# Patient Record
Sex: Male | Born: 1995 | Race: Black or African American | Hispanic: No | Marital: Single | Smoking: Never smoker
Health system: Southern US, Community
[De-identification: ages and names within clinical notes are randomized; demographics above are authoritative.]

## PROBLEM LIST (undated history)

## (undated) DIAGNOSIS — R011 Cardiac murmur, unspecified: Secondary | ICD-10-CM

## (undated) HISTORY — PX: TYMPANOSTOMY TUBE PLACEMENT: SHX32

---
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
## 2017-01-25 ENCOUNTER — Telehealth: Payer: Self-pay | Admitting: Pediatrics

## 2017-01-25 DIAGNOSIS — R590 Localized enlarged lymph nodes: Secondary | ICD-10-CM

## 2017-01-25 NOTE — Telephone Encounter (Signed)
Spoke with mother back in December and mother stated she would call the ENT and schedule appointment for when patient was on spring Break. Called mother today to confirm that she would call ENT as soon as she knew the spring break dates for patient.

## 2017-01-25 NOTE — Telephone Encounter (Signed)
Discuss results of CBC with mom and wnl.  She never got a call from ENT to schedule for cervical Lymphadnopathy but she thinks he will only be able to be seen during spring break.  Told to discuss with him to find out the dates and we can try an let ENT know this when its scheduled to try and get a date that works.

## 2017-01-25 NOTE — Telephone Encounter (Signed)
Mom would like the results of the last lab work done over the holidays per mom please

## 2017-03-03 ENCOUNTER — Ambulatory Visit (INDEPENDENT_AMBULATORY_CARE_PROVIDER_SITE_OTHER): Payer: Self-pay | Admitting: Pediatrics

## 2017-03-03 VITALS — Wt 220.0 lb

## 2017-03-03 DIAGNOSIS — B349 Viral infection, unspecified: Secondary | ICD-10-CM

## 2017-03-03 DIAGNOSIS — R509 Fever, unspecified: Secondary | ICD-10-CM

## 2017-03-03 LAB — POCT INFLUENZA B: Rapid Influenza B Ag: NEGATIVE

## 2017-03-03 LAB — POCT INFLUENZA A: RAPID INFLUENZA A AGN: NEGATIVE

## 2017-03-03 NOTE — Patient Instructions (Signed)
Viral Illness, Pediatric Viruses are tiny germs that can get into a person's body and cause illness. There are many different types of viruses, and they cause many types of illness. Viral illness in children is very common. A viral illness can cause fever, sore throat, cough, rash, or diarrhea. Most viral illnesses that affect children are not serious. Most go away after several days without treatment. The most common types of viruses that affect children are:  Cold and flu viruses.  Stomach viruses.  Viruses that cause fever and rash. These include illnesses such as measles, rubella, roseola, fifth disease, and chicken pox. Viral illnesses also include serious conditions such as HIV/AIDS (human immunodeficiency virus/acquired immunodeficiency syndrome). A few viruses have been linked to certain cancers. What are the causes? Many types of viruses can cause illness. Viruses invade cells in your child's body, multiply, and cause the infected cells to malfunction or die. When the cell dies, it releases more of the virus. When this happens, your child develops symptoms of the illness, and the virus continues to spread to other cells. If the virus takes over the function of the cell, it can cause the cell to divide and grow out of control, as is the case when a virus causes cancer. Different viruses get into the body in different ways. Your child is most likely to catch a virus from being exposed to another person who is infected with a virus. This may happen at home, at school, or at child care. Your child may get a virus by:  Breathing in droplets that have been coughed or sneezed into the air by an infected person. Cold and flu viruses, as well as viruses that cause fever and rash, are often spread through these droplets.  Touching anything that has been contaminated with the virus and then touching his or her nose, mouth, or eyes. Objects can be contaminated with a virus if:  They have droplets on  them from a recent cough or sneeze of an infected person.  They have been in contact with the vomit or stool (feces) of an infected person. Stomach viruses can spread through vomit or stool.  Eating or drinking anything that has been in contact with the virus.  Being bitten by an insect or animal that carries the virus.  Being exposed to blood or fluids that contain the virus, either through an open cut or during a transfusion. What are the signs or symptoms? Symptoms vary depending on the type of virus and the location of the cells that it invades. Common symptoms of the main types of viral illnesses that affect children include: Cold and flu viruses   Fever.  Sore throat.  Aches and headache.  Stuffy nose.  Earache.  Cough. Stomach viruses   Fever.  Loss of appetite.  Vomiting.  Stomachache.  Diarrhea. Fever and rash viruses   Fever.  Swollen glands.  Rash.  Runny nose. How is this treated? Most viral illnesses in children go away within 3?10 days. In most cases, treatment is not needed. Your child's health care provider may suggest over-the-counter medicines to relieve symptoms. A viral illness cannot be treated with antibiotic medicines. Viruses live inside cells, and antibiotics do not get inside cells. Instead, antiviral medicines are sometimes used to treat viral illness, but these medicines are rarely needed in children. Many childhood viral illnesses can be prevented with vaccinations (immunization shots). These shots help prevent flu and many of the fever and rash viruses. Follow these instructions at  home: Medicines   Give over-the-counter and prescription medicines only as told by your child's health care provider. Cold and flu medicines are usually not needed. If your child has a fever, ask the health care provider what over-the-counter medicine to use and what amount (dosage) to give.  Do not give your child aspirin because of the association with  Reye syndrome.  If your child is older than 4 years and has a cough or sore throat, ask the health care provider if you can give cough drops or a throat lozenge.  Do not ask for an antibiotic prescription if your child has been diagnosed with a viral illness. That will not make your child's illness go away faster. Also, frequently taking antibiotics when they are not needed can lead to antibiotic resistance. When this develops, the medicine no longer works against the bacteria that it normally fights. Eating and drinking    If your child is vomiting, give only sips of clear fluids. Offer sips of fluid frequently. Follow instructions from your child's health care provider about eating or drinking restrictions.  If your child is able to drink fluids, have the child drink enough fluid to keep his or her urine clear or pale yellow. General instructions   Make sure your child gets a lot of rest.  If your child has a stuffy nose, ask your child's health care provider if you can use salt-water nose drops or spray.  If your child has a cough, use a cool-mist humidifier in your child's room.  If your child is older than 1 year and has a cough, ask your child's health care provider if you can give teaspoons of honey and how often.  Keep your child home and rested until symptoms have cleared up. Let your child return to normal activities as told by your child's health care provider.  Keep all follow-up visits as told by your child's health care provider. This is important. How is this prevented? To reduce your child's risk of viral illness:  Teach your child to wash his or her hands often with soap and water. If soap and water are not available, he or she should use hand sanitizer.  Teach your child to avoid touching his or her nose, eyes, and mouth, especially if the child has not washed his or her hands recently.  If anyone in the household has a viral infection, clean all household surfaces  that may have been in contact with the virus. Use soap and hot water. You may also use diluted bleach.  Keep your child away from people who are sick with symptoms of a viral infection.  Teach your child to not share items such as toothbrushes and water bottles with other people.  Keep all of your child's immunizations up to date.  Have your child eat a healthy diet and get plenty of rest. Contact a health care provider if:  Your child has symptoms of a viral illness for longer than expected. Ask your child's health care provider how long symptoms should last.  Treatment at home is not controlling your child's symptoms or they are getting worse. Get help right away if:  Your child who is younger than 3 months has a temperature of 100F (38C) or higher.  Your child has vomiting that lasts more than 24 hours.  Your child has trouble breathing.  Your child has a severe headache or has a stiff neck. This information is not intended to replace advice given to you by  your health care provider. Make sure you discuss any questions you have with your health care provider. Document Released: 04/24/2016 Document Revised: 05/27/2016 Document Reviewed: 04/24/2016 Elsevier Interactive Patient Education  2017 [employer].

## 2017-03-04 ENCOUNTER — Encounter: Payer: Self-pay | Admitting: Pediatrics

## 2017-03-04 DIAGNOSIS — R509 Fever, unspecified: Secondary | ICD-10-CM | POA: Insufficient documentation

## 2017-03-04 DIAGNOSIS — B349 Viral infection, unspecified: Secondary | ICD-10-CM | POA: Insufficient documentation

## 2017-03-04 NOTE — Progress Notes (Signed)
Subjective:     History was provided by the patient and mother. [name] is a 21 y.o. male here for evaluation of congestion, fever and tiredness/weakness. Symptoms began 2 days ago, with little improvement since that time. Associated symptoms include nasal congestion, nonproductive cough and sneezing. Patient denies chills, dyspnea, eye irritation and myalgias.   The following portions of the patient's history were reviewed and updated as appropriate: allergies, current medications, past family history, past medical history, past social history, past surgical history and problem list.  Review of Systems Pertinent items are noted in HPI   Objective:    Wt 220 lb (99.8 kg)   BMI 27.50 kg/m  General:   alert, cooperative and no distress  HEENT:   ENT exam normal, no neck nodes or sinus tenderness, airway not compromised and postnasal drip noted  Neck:  no adenopathy and supple, symmetrical, trachea midline.  Lungs:  clear to auscultation bilaterally  Heart:  regular rate and rhythm, S1, S2 normal, no murmur, click, rub or gallop  Abdomen:   soft, non-tender; bowel sounds normal; no masses,  no organomegaly  Skin:   reveals no rash     Extremities:   extremities normal, atraumatic, no cyanosis or edema     Neurological:  alert, oriented x 3, no defects noted in general exam.     Assessment:    Non-specific viral syndrome.   Plan:    Normal progression of disease discussed. All questions answered. Explained the rationale for symptomatic treatment rather than use of an antibiotic. Instruction provided in the use of fluids, vaporizer, acetaminophen, and other OTC medication for symptom control. Extra fluids Analgesics as needed, dose reviewed. Follow up as needed should symptoms fail to improve.

## 2017-07-16 ENCOUNTER — Encounter: Payer: Self-pay | Admitting: Pediatrics

## 2017-10-17 IMAGING — CT CT MAXILLOFACIAL W/O CM
3 series · 16 of 47 positions shown, 19 images · non-contrast
Comparison: None.

CLINICAL DATA: Right jaw pain after fall.

EXAM:
CT MAXILLOFACIAL WITHOUT CONTRAST
TECHNIQUE: Multidetector CT imaging of the maxillofacial structures was
performed. Multiplanar CT image reconstructions were also generated.
A small metallic BB was placed on the right temple in order to
reliably differentiate right from left.

[Series 2: facialbone 2.0 st · axial · 0.32mm/px · z∈[-277,-143]mm · 10 of 79 slices shown, 13 images]
[im 6/79  brain]
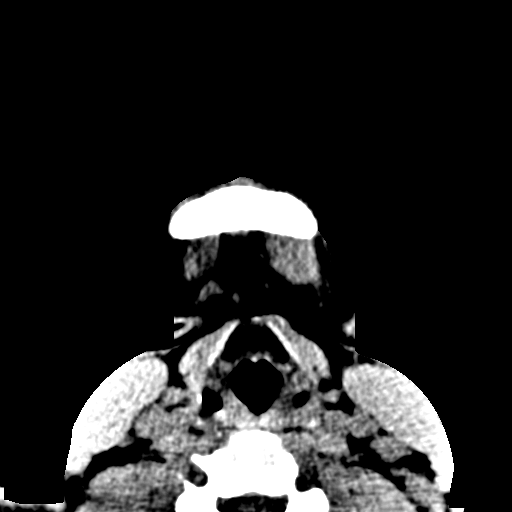
[im 6/79  bone]
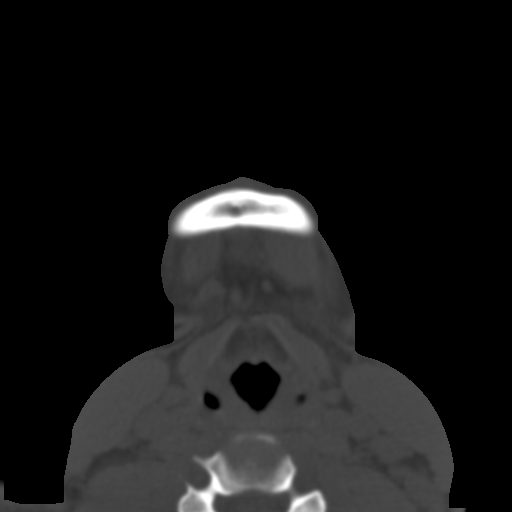
[im 14/79  bone]
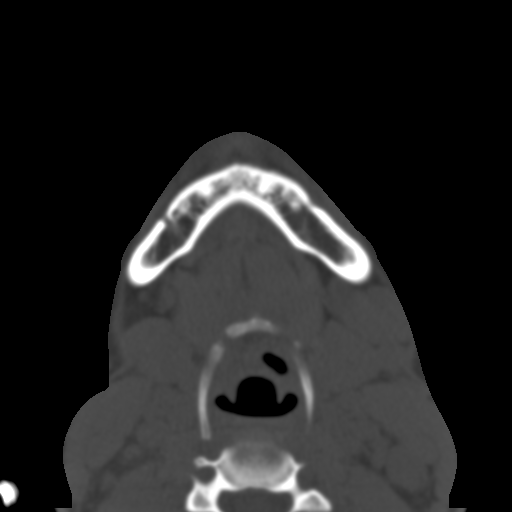
[im 22/79  bone]
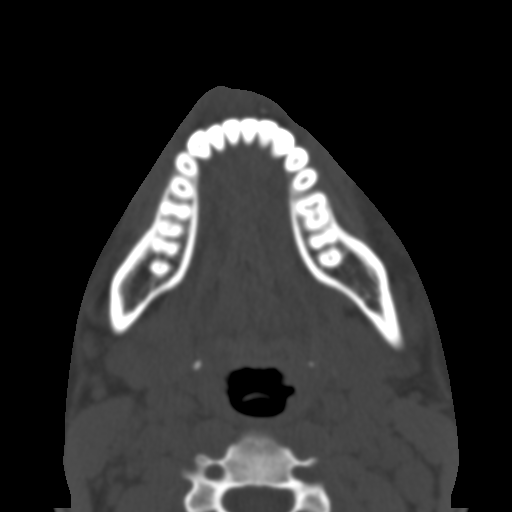
[im 27/79  bone]
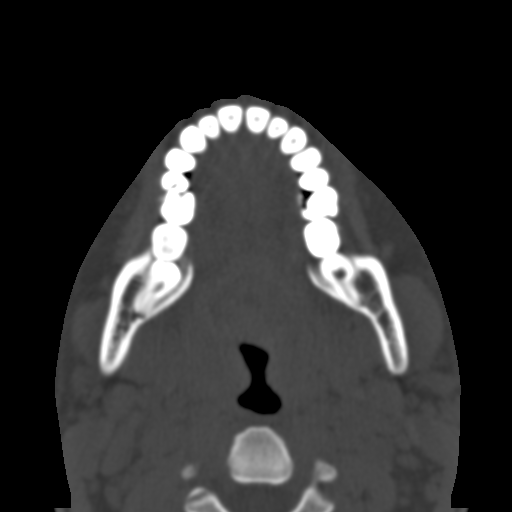
[im 35/79  brain]
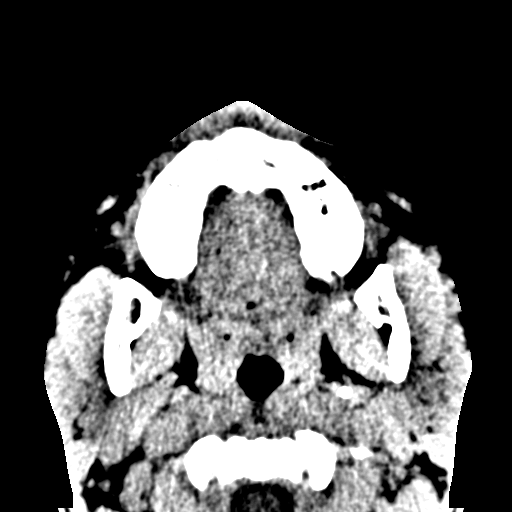
[im 35/79  bone]
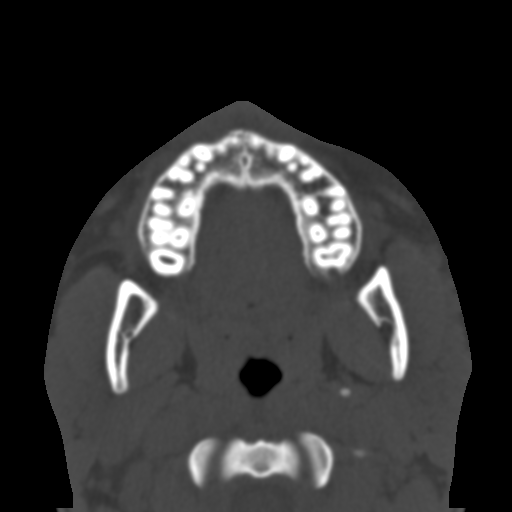
[im 44/79  bone]
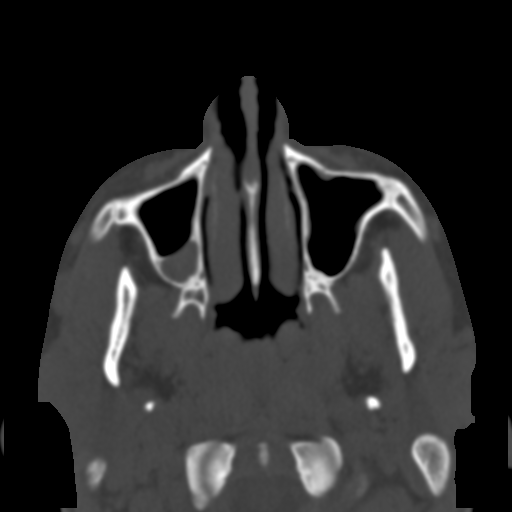
[im 52/79  bone]
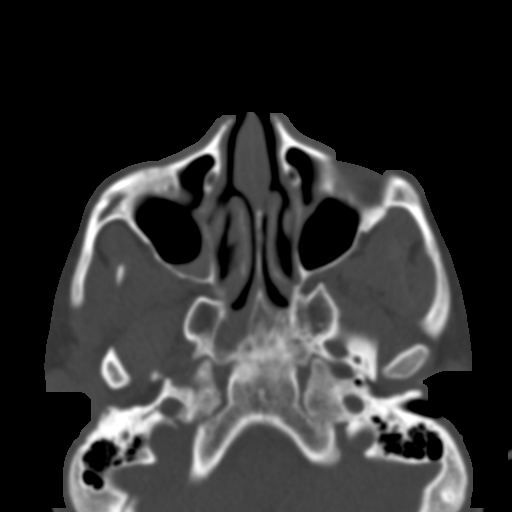
[im 60/79  bone]
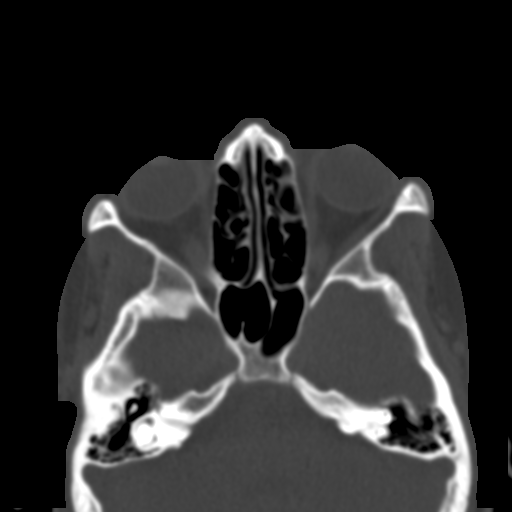
[im 65/79  brain]
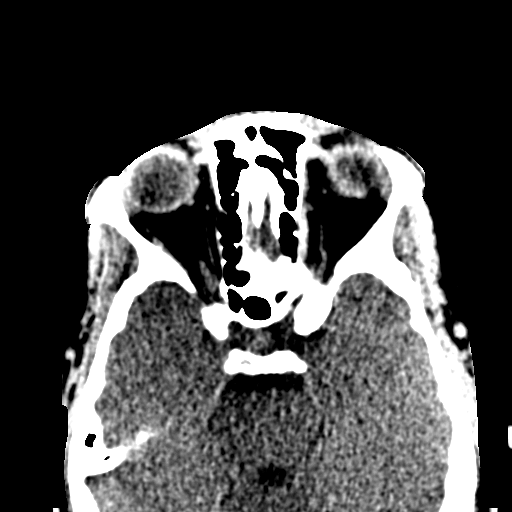
[im 65/79  bone]
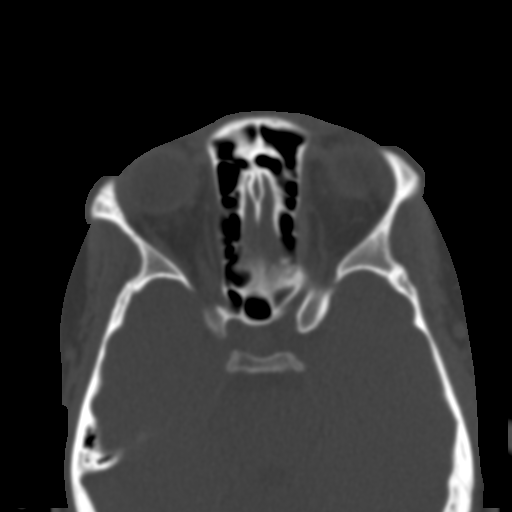
[im 73/79  bone]
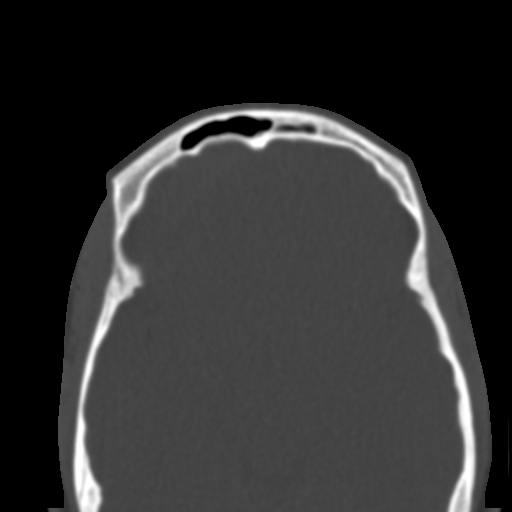

[Series 6: facialbone 2.0 cor st · coronal · 0.33mm/px · 3 of 76 slices shown]
[im 26/76  bone]
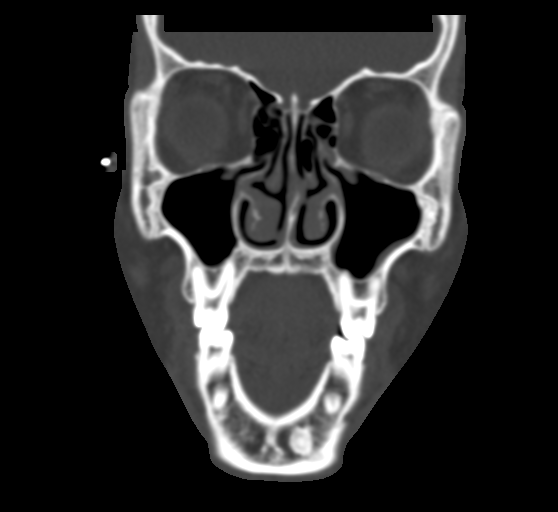
[im 34/76  bone]
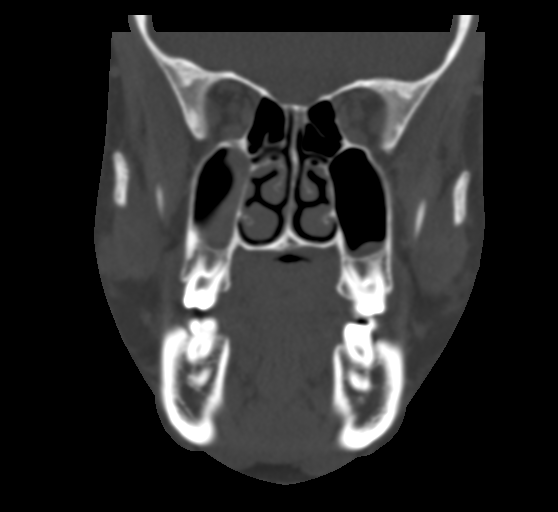
[im 42/76  bone]
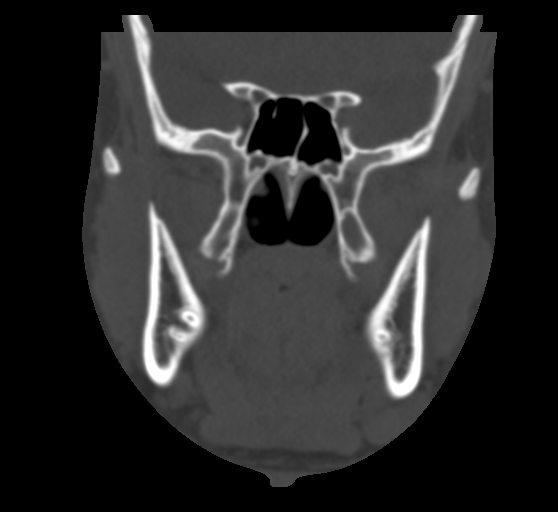

[Series 7: facialbone 2.0 sag st · sagittal · 0.32mm/px · 3 of 81 slices shown]
[im 27/81  bone]
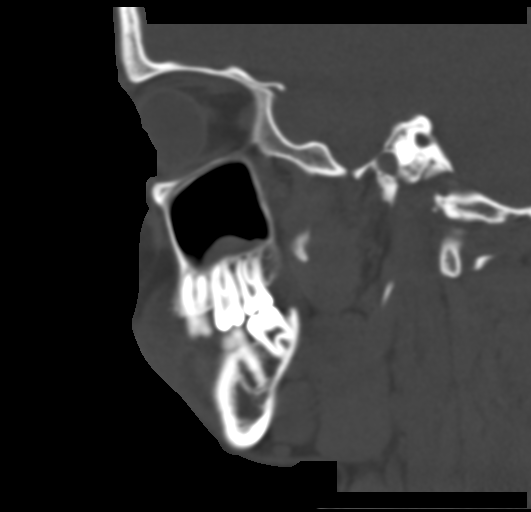
[im 41/81  bone]
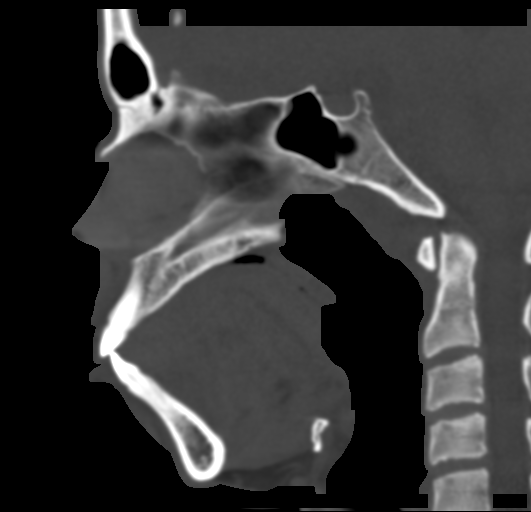
[im 54/81  bone]
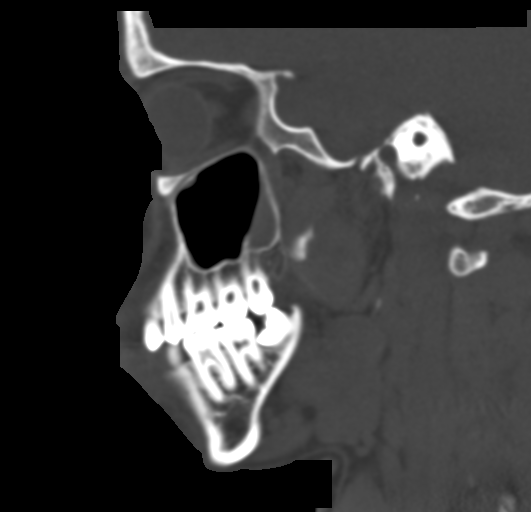

[16 of 47 positions shown; findings below may reference images not displayed]

FINDINGS: Osseous: No fracture or other bony abnormality is noted.

Orbits: Globes and orbits appear normal.

Sinuses: Mild mucosal thickening is noted in both maxillary sinuses.
Remaining paranasal sinuses appear normal.

Soft tissues: Mild bilateral posterior cervical adenopathy is noted,
with left greater than right. Largest lymph node measures 11 mm 17
mm in left submandibular region.

Limited intracranial: No definite abnormality seen.
IMPRESSION: Mild bilateral maxillary sinusitis.

Mild bilateral posterior cervical adenopathy is noted, as well as
significantly enlarged left submandibular adenopathy. This may
simply be inflammatory in etiology, but neoplasm cannot be excluded.
Clinical correlation is recommended.

No evidence of fracture or other bony abnormality is noted.

## 2017-12-15 ENCOUNTER — Other Ambulatory Visit: Payer: Self-pay

## 2017-12-15 ENCOUNTER — Emergency Department (HOSPITAL_COMMUNITY)
Admission: EM | Admit: 2017-12-15 | Discharge: 2017-12-16 | Disposition: A | Discharge status: Home / Self Care | Payer: BC Managed Care – PPO | Attending: Emergency Medicine | Admitting: Emergency Medicine

## 2017-12-15 ENCOUNTER — Encounter (HOSPITAL_COMMUNITY): Payer: Self-pay | Admitting: Emergency Medicine

## 2017-12-15 DIAGNOSIS — W500XXA Accidental hit or strike by another person, initial encounter: Secondary | ICD-10-CM | POA: Diagnosis not present

## 2017-12-15 DIAGNOSIS — J45909 Unspecified asthma, uncomplicated: Secondary | ICD-10-CM | POA: Insufficient documentation

## 2017-12-15 DIAGNOSIS — S01511A Laceration without foreign body of lip, initial encounter: Secondary | ICD-10-CM | POA: Diagnosis not present

## 2017-12-15 DIAGNOSIS — Y998 Other external cause status: Secondary | ICD-10-CM | POA: Insufficient documentation

## 2017-12-15 DIAGNOSIS — Y92310 Basketball court as the place of occurrence of the external cause: Secondary | ICD-10-CM | POA: Insufficient documentation

## 2017-12-15 DIAGNOSIS — Z23 Encounter for immunization: Secondary | ICD-10-CM | POA: Diagnosis not present

## 2017-12-15 DIAGNOSIS — Y9367 Activity, basketball: Secondary | ICD-10-CM | POA: Insufficient documentation

## 2017-12-15 MED ORDER — TETANUS-DIPHTH-ACELL PERTUSSIS 5-2.5-18.5 LF-MCG/0.5 IM SUSP
0.5000 mL | Freq: Once | INTRAMUSCULAR | Status: AC
  Administered 2017-12-15: 0.5 mL via INTRAMUSCULAR
  Filled 2017-12-15: qty 0.5

## 2017-12-15 MED ORDER — LIDOCAINE-EPINEPHRINE (PF) 2 %-1:200000 IJ SOLN
10.0000 mL | Freq: Once | INTRAMUSCULAR | Status: AC
  Administered 2017-12-15: 5 mL via INTRADERMAL
  Filled 2017-12-15: qty 20

## 2017-12-15 NOTE — ED Provider Notes (Signed)
[facility] EMERGENCY DEPARTMENT Provider Note   CSN: [id number] Arrival date & time: 12/15/17  2154     History   Chief Complaint Chief Complaint  Patient presents with  . Lip Laceration    HPI Benjamin Doyle is a 21 y.o. male.  HPI   Benjamin Doyle is a 21 year old male with a history of asthma who presents to the emergency department for evaluation of right upper lip laceration.  Patient states that he was playing basketball about 2 hours prior to arrival and was elbowed in the lip.  Noticed immediate bleeding which ceased with pressure.  He did not clean out the wound.  He denies tooth injury or in her mouth injury.  Denies pain to the lip.  Denies facial swelling, fever, dysphagia.  He is unsure if he is up-to-date on his tetanus shot.  Past Medical History:  Diagnosis Date  . Asthma   . Heart murmur     Patient Active Problem List   Diagnosis Date Noted  . Viral syndrome 03/04/2017  . Fever 03/04/2017  . Cervical lymphadenopathy 12/25/2016  . Well child check 09/23/2015  . Immunization due 08/17/2014  . Subconjunctival hemorrhage of right eye 08/17/2014  . BMI (body mass index), pediatric, 85% to less than 95% for age 58/17/2013  . Idiopathic gynecomastia 10/13/2012    Past Surgical History:  Procedure Laterality Date  . TYMPANOSTOMY TUBE PLACEMENT         Home Medications    Prior to Admission medications   Medication Sig Start Date End Date Taking? Authorizing Provider  albuterol (PROVENTIL HFA;VENTOLIN HFA) 108 (90 BASE) MCG/ACT inhaler Inhale 2 puffs into the lungs every 6 (six) hours as needed for wheezing or shortness of breath. 01/19/14 02/19/14  Marcha Solders, MD    Family History Family History  Problem Relation Age of Onset  . Hypertension Mother   . Hyperlipidemia Father   . Hypertension Father   . Hypertension Maternal Grandmother   . Diabetes Maternal Grandfather   . Birth defects Paternal Grandmother   .  Hyperlipidemia Paternal Grandmother   . Hypertension Paternal Grandmother   . Hypertension Paternal Grandfather   . Alcohol abuse Neg Hx   . Arthritis Neg Hx   . Asthma Neg Hx   . Cancer Neg Hx   . COPD Neg Hx   . Depression Neg Hx   . Drug abuse Neg Hx   . Early death Neg Hx   . Hearing loss Neg Hx   . Heart disease Neg Hx   . Kidney disease Neg Hx   . Learning disabilities Neg Hx   . Mental illness Neg Hx   . Mental retardation Neg Hx   . Miscarriages / Stillbirths Neg Hx   . Stroke Neg Hx   . Vision loss Neg Hx   . Varicose Veins Neg Hx     Social History Social History   Tobacco Use  . Smoking status: Never Smoker  . Smokeless tobacco: Never Used  Substance Use Topics  . Alcohol use: No    Frequency: Never  . Drug use: No     Allergies   Patient has no known allergies.   Review of Systems Review of Systems  Constitutional: Negative for chills and fever.  HENT: Negative for facial swelling and trouble swallowing.   Skin: Positive for wound (right upper lip).     Physical Exam Updated Vital Signs BP 136/64 (BP Location: Right Arm)   Pulse 87  Temp 99 F (37.2 C) (Oral)   Resp 16   Ht 6\' 3"  (1.905 m)   Wt 87.5 kg (193 lb)   SpO2 100%   BMI 24.12 kg/m   Physical Exam  Constitutional: He appears well-developed and well-nourished. No distress.  HENT:  Head: Normocephalic and atraumatic.  Mouth/Throat: Oropharynx is clear and moist. No oropharyngeal exudate.    Approximately 1 cm laceration on the right upper lip.  It is approximately 80mm deep.  No tenderness overlying.  No surrounding erythema, edema, warmth.  Teeth intact.  No inner lip laceration.   Eyes: Right eye exhibits no discharge. Left eye exhibits no discharge.  Pulmonary/Chest: Effort normal. No respiratory distress.  Neurological: He is alert. Coordination normal.  Skin: He is not diaphoretic.  Psychiatric: He has a normal mood and affect. His behavior is normal.  Nursing note and  vitals reviewed.    ED Treatments / Results  Labs (all labs ordered are listed, but only abnormal results are displayed) Labs Reviewed - No data to display  EKG  EKG Interpretation None       Radiology No results found.  Procedures .Marland KitchenLaceration Repair Date/Time: 12/16/2017 12:13 AM Performed by: Glyn Ade, PA-C Authorized by: Glyn Ade, PA-C   Consent:    Consent obtained:  Verbal and emergent situation   Consent given by:  Patient   Risks discussed:  Infection, pain, poor cosmetic result and poor wound healing   Alternatives discussed:  No treatment and delayed treatment Anesthesia (see MAR for exact dosages):    Anesthesia method:  Local infiltration   Local anesthetic:  Lidocaine 2% WITH epi Laceration details:    Location:  Lip   Lip location:  Upper exterior lip   Length (cm):  1   Depth (mm):  6 Repair type:    Repair type:  Simple Pre-procedure details:    Preparation:  Patient was prepped and draped in usual sterile fashion Exploration:    Hemostasis achieved with:  Direct pressure   Wound exploration: wound explored through full range of motion and entire depth of wound probed and visualized   Treatment:    Area cleansed with:  Betadine   Amount of cleaning:  Extensive   Irrigation solution:  Sterile saline   Irrigation method:  Pressure wash Skin repair:    Repair method:  Sutures   Suture size:  6-0   Suture material:  Prolene   Suture technique:  Simple interrupted   Number of sutures:  2 Post-procedure details:    Dressing:  Open (no dressing)   Patient tolerance of procedure:  Tolerated well, no immediate complications   (including critical care time)  Medications Ordered in ED Medications  lidocaine-EPINEPHrine (XYLOCAINE W/EPI) 2 %-1:200000 (PF) injection 10 mL (not administered)  Tdap (BOOSTRIX) injection 0.5 mL (not administered)     Initial Impression / Assessment and Plan / ED Course  I have reviewed the  triage vital signs and the nursing notes.  Pertinent labs & imaging results that were available during my care of the patient were reviewed by me and considered in my medical decision making (see chart for details).    Pressure irrigation performed. Wound explored and base of wound visualized in a bloodless field without evidence of foreign body.  Laceration occurred < 8 hours prior to repair which was well tolerated. Tdap updated.  Pt has no comorbidities to effect normal wound healing. Pt discharged without antibiotics.  Discussed suture home care with patient and  answered questions. Pt to follow-up for wound check and suture removal in 7 days; they are to return to the ED sooner for signs of infection. Pt is hemodynamically stable with no complaints prior to dc.   Final Clinical Impressions(s) / ED Diagnoses   Final diagnoses:  Lip laceration, initial encounter    ED Discharge Orders    None       Bernarda Caffey 12/16/17 0106    Tegeler, Gwenyth Allegra, MD 12/16/17 [phone]

## 2017-12-15 NOTE — ED Triage Notes (Signed)
Pt st's he was elbowed in the mouth.  Pt has lac to right upper lip.  Pt denies dental injury

## 2017-12-15 NOTE — ED Notes (Signed)
Patient is A&Ox4.  No signs of distress noted.  Please see providers complete history and physical exam.  

## 2017-12-16 NOTE — Discharge Instructions (Signed)
Your Tdap was updated in the emergency department today.  Please gently wash the wound with warm water beginning tomorrow.  Do this once a day.  Schedule an appointment with your pediatrician to have the sutures removed in 7 days.  Return to the emergency department if you notice worsening redness and swelling over the stitches, fever greater than 100.4 F or have any new or worsening symptoms.

## 2017-12-23 ENCOUNTER — Ambulatory Visit: Payer: BC Managed Care – PPO | Admitting: Pediatrics

## 2017-12-23 VITALS — Wt 189.5 lb

## 2017-12-23 DIAGNOSIS — Z4802 Encounter for removal of sutures: Secondary | ICD-10-CM

## 2017-12-23 NOTE — Patient Instructions (Signed)
Suture Removal, Care After Refer to this sheet in the next few weeks. These instructions provide you with information on caring for yourself after your procedure. Your health care provider may also give you more specific instructions. Your treatment has been planned according to current medical practices, but problems sometimes occur. Call your health care provider if you have any problems or questions after your procedure. What can I expect after the procedure? After your stitches (sutures) are removed, it is typical to have the following:  Some discomfort and swelling in the wound area.  Slight redness in the area.  Follow these instructions at home:  If you have skin adhesive strips over the wound area, do not take the strips off. They will fall off on their own in a few days. If the strips remain in place after 14 days, you may remove them.  Change any bandages (dressings) at least once a day or as directed by your health care provider. If the bandage sticks, soak it off with warm, soapy water.  Apply cream or ointment only as directed by your health care provider. If using cream or ointment, wash the area with soap and water 2 times a day to remove all the cream or ointment. Rinse off the soap and pat the area dry with a clean towel.  Keep the wound area dry and clean. If the bandage becomes wet or dirty, or if it develops a bad smell, change it as soon as possible.  Continue to protect the wound from injury.  Use sunscreen when out in the sun. New scars become sunburned easily. Contact a health care provider if:  You have increasing redness, swelling, or pain in the wound.  You see pus coming from the wound.  You have a fever.  You notice a bad smell coming from the wound or dressing.  Your wound breaks open (edges not staying together). This information is not intended to replace advice given to you by your health care provider. Make sure you discuss any questions you have  with your health care provider. Document Released: 09/08/2001 Document Revised: 05/21/2016 Document Reviewed: 07/26/2013 Elsevier Interactive Patient Education  2017 [employer]  

## 2017-12-23 NOTE — Progress Notes (Signed)
  Subjective:    Benjamin Doyle is a 21 y.o. old male here with his patient for Suture / Staple Removal   HPI: Benjamin Doyle presents with history of recent laceration to right upper lip from basketball.  He was seen in the ED a week ago and 2 stitches were placed.  There as been no sign of infection and he denies any current pain.    The following portions of the patient's history were reviewed and updated as appropriate: allergies, current medications, past family history, past medical history, past social history, past surgical history and problem list.  Review of Systems Pertinent items are noted in HPI.   Allergies: No Known Allergies   Current Outpatient Medications on File Prior to Visit  Medication Sig Dispense Refill  . albuterol (PROVENTIL HFA;VENTOLIN HFA) 108 (90 BASE) MCG/ACT inhaler Inhale 2 puffs into the lungs every 6 (six) hours as needed for wheezing or shortness of breath. 2 Inhaler 3   No current facility-administered medications on file prior to visit.     History and Problem List: Past Medical History:  Diagnosis Date  . Asthma   . Heart murmur         Objective:    Wt 189 lb 8 oz (86 kg)   BMI 23.69 kg/m   General: alert, active, cooperative, non toxic Skin: no rashes, stitches to lip boarder Neuro: normal mental status, No focal deficits  No results found for this or any previous visit (from the past 72 hour(s)).     Assessment:   Benjamin Doyle is a 21 y.o. old male with  1. Visit for suture removal     Plan:   1.  Sutures removed with no complication.  Discussed normal post suture care.      No orders of the defined types were placed in this encounter.    Return if symptoms worsen or fail to improve. in 2-3 days or prior for concerns  Kristen Loader, DO

## 2017-12-28 ENCOUNTER — Encounter: Payer: Self-pay | Admitting: Pediatrics

## 2018-02-21 ENCOUNTER — Ambulatory Visit (INDEPENDENT_AMBULATORY_CARE_PROVIDER_SITE_OTHER): Payer: BC Managed Care – PPO | Admitting: Pediatrics

## 2018-02-21 VITALS — Temp 98.9°F | Wt 181.7 lb

## 2018-02-21 DIAGNOSIS — R590 Localized enlarged lymph nodes: Secondary | ICD-10-CM

## 2018-02-21 NOTE — Progress Notes (Signed)
  Subjective:    Benjamin Doyle is a 22 y.o. old male here with his mother for questions    HPI: Artin presents with history of cervical lymph nodes.  He continues to have the cervical nodes on left that have not changed since last visit.  He reports that it has stayed the same size.  He has lost weight and has lost about 50lb in 2 years but has been a lot of cardio, eating habits and working out and has been trying to get healthier.  He was seen for enlarged cervical nodes last year and they are unchanged but never f/u with ENT.  He had questions about some other spots on his body in his legs where he feels lumpy areas.  Denies any fevers, HA, night sweats, diarrhea, diff breathing.      The following portions of the patient's history were reviewed and updated as appropriate: allergies, current medications, past family history, past medical history, past social history, past surgical history and problem list.  Review of Systems Pertinent items are noted in HPI.   Allergies: No Known Allergies   Current Outpatient Medications on File Prior to Visit  Medication Sig Dispense Refill  . albuterol (PROVENTIL HFA;VENTOLIN HFA) 108 (90 BASE) MCG/ACT inhaler Inhale 2 puffs into the lungs every 6 (six) hours as needed for wheezing or shortness of breath. 2 Inhaler 3   No current facility-administered medications on file prior to visit.     History and Problem List: Past Medical History:  Diagnosis Date  . Asthma   . Heart murmur         Objective:    Temp 98.9 F (37.2 C) (Temporal)   Wt 181 lb 11.2 oz (82.4 kg)   BMI 22.71 kg/m   General: alert, active, cooperative, non toxic ENT: oropharynx moist, no lesions, nares no discharge, 2 enlarged left cervical nodes largest 2-2.5cm mobile and not tender, submandibular 2cm Eye:  PERRL, EOMI, conjunctivae clear, no discharge Ears: TM clear/intact bilateral, no discharge Neck: supple, no sig LAD Lungs: clear to auscultation, no wheeze, crackles  or retractions Heart: RRR, Nl S1, S2, no murmurs Abd: soft, non tender, non distended, normal BS, no organomegaly, no masses appreciated Lymph.  No significant enlarged nodes in clavicular, axillary, inguinal area Skin: no rashes Neuro: normal mental status, No focal deficits  No results found for this or any previous visit (from the past 72 hour(s)).     Assessment:   Layman is a 22 y.o. old male with  1. Cervical lymphadenopathy     Plan:   1.  Discussed again to f/u with ENT.  Did not go last year but he says he will make appointment and go this spring break to get evaluated.  Discuss that ENT may want to biopsy as he has had them for 4-5 years.  He has had an increase of weight loss but reports he has significantly changed his diet and regular exercise trying to get fit.      No orders of the defined types were placed in this encounter.    Return if symptoms worsen or fail to improve. in 2-3 days or prior for concerns  Kristen Loader, DO

## 2018-02-23 ENCOUNTER — Encounter: Payer: Self-pay | Admitting: Pediatrics

## 2018-02-23 NOTE — Patient Instructions (Signed)
Lymphadenopathy Lymphadenopathy refers to swollen or enlarged lymph glands, also called lymph nodes. Lymph glands are part of your body's defense (immune) system, which protects the body from infections, germs, and diseases. Lymph glands are found in many locations in your body, including the neck, underarm, and groin. Many things can cause lymph glands to become enlarged. When your immune system responds to germs, such as viruses or bacteria, infection-fighting cells and fluid build up. This causes the glands to grow in size. Usually, this is not something to worry about. The swelling and any soreness often go away without treatment. However, swollen lymph glands can also be caused by a number of diseases. Your health care provider may do various tests to help determine the cause. If the cause of your swollen lymph glands cannot be found, it is important to monitor your condition to make sure the swelling goes away. Follow these instructions at home: Watch your condition for any changes. The following actions may help to lessen any discomfort you are feeling:  Get plenty of rest.  Take medicines only as directed by your health care provider. Your health care provider may recommend over-the-counter medicines for pain.  Apply moist heat compresses to the site of swollen lymph nodes as directed by your health care provider. This can help reduce any pain.  Check your lymph nodes daily for any changes.  Keep all follow-up visits as directed by your health care provider. This is important.  Contact a health care provider if:  Your lymph nodes are still swollen after 2 weeks.  Your swelling increases or spreads to other areas.  Your lymph nodes are hard, seem fixed to the skin, or are growing rapidly.  Your skin over the lymph nodes is red and inflamed.  You have a fever.  You have chills.  You have fatigue.  You develop a sore throat.  You have abdominal pain.  You have weight  loss.  You have night sweats. Get help right away if:  You notice fluid leaking from the area of the enlarged lymph node.  You have severe pain in any area of your body.  You have chest pain.  You have shortness of breath. This information is not intended to replace advice given to you by your health care provider. Make sure you discuss any questions you have with your health care provider. Document Released: 09/22/2008 Document Revised: 05/21/2016 Document Reviewed: 07/19/2014 Elsevier Interactive Patient Education  2018 [employer]  

## 2018-10-03 ENCOUNTER — Ambulatory Visit: Payer: BC Managed Care – PPO | Admitting: Pediatrics

## 2018-10-03 VITALS — Wt 189.3 lb

## 2018-10-03 DIAGNOSIS — R21 Rash and other nonspecific skin eruption: Secondary | ICD-10-CM

## 2018-10-03 DIAGNOSIS — Z23 Encounter for immunization: Secondary | ICD-10-CM | POA: Insufficient documentation

## 2018-10-03 DIAGNOSIS — L299 Pruritus, unspecified: Secondary | ICD-10-CM

## 2018-10-03 MED ORDER — PERMETHRIN 5 % EX CREA: 1.0000 "application " | TOPICAL_CREAM | Freq: Once | CUTANEOUS | 1 refills | Status: AC

## 2018-10-03 MED ORDER — CETIRIZINE HCL 10 MG PO TABS: 10.0000 mg | ORAL_TABLET | Freq: Every day | ORAL | 2 refills | Status: AC

## 2018-10-03 NOTE — Patient Instructions (Addendum)
Hives Hives (urticaria) are itchy, red, swollen areas on your skin. Hives can show up on any part of your body, and they can vary in size. They can be as small as the tip of a pen or much larger. Hives often fade within 24 hours (acute hives). In other cases, new hives show up after old ones fade. This can continue for many days or weeks (chronic hives). Hives are caused by your body's reaction to an irritant or to something that you are allergic to (trigger). You can get hives right after being around a trigger or hours later. Hives do not spread from person to person (are not contagious). Hives may get worse if you scratch them, if you exercise, or if you have worries (emotional stress). Follow these instructions at home: Medicines  Take or apply over-the-counter and prescription medicines only as told by your doctor.  If you were prescribed an antibiotic medicine, use it as told by your doctor. Do not stop taking the antibiotic even if you start to feel better. Skin Care  Apply cool, wet cloths (cool compresses) to the itchy, red, swollen areas.  Do not scratch your skin. Do not rub your skin. General instructions  Do not take hot showers or baths. This can make itching worse.  Do not wear tight clothes.  Use sunscreen and wear clothing that covers your skin when you are outside.  Avoid any triggers that cause your hives. Keep a journal to help you keep track of what causes your hives. Write down: ? What medicines you take. ? What you eat and drink. ? What products you use on your skin.  Keep all follow-up visits as told by your doctor. This is important. Contact a doctor if:  Your symptoms are not better with medicine.  Your joints are painful or swollen. Get help right away if:  You have a fever.  You have belly pain.  Your tongue or lips are swollen.  Your eyelids are swollen.  Your chest or throat feels tight.  You have trouble breathing or swallowing. These  symptoms may be an emergency. Do not wait to see if the symptoms will go away. Get medical help right away. Call your local emergency services (911 in the [place] Do not drive yourself to the hospital. This information is not intended to replace advice given to you by your health care provider. Make sure you discuss any questions you have with your health care provider. Document Released: 09/22/2008 Document Revised: 05/21/2016 Document Reviewed: 10/02/2015 Elsevier Interactive Patient Education  2018 [place] are tiny bugs that live in and around beds. During the day, they stay hidden. At night, they come out and bite. Where are bedbugs found? Bedbugs can be found anywhere. It does not matter if a place is clean or dirty. They are often found in:  Hotels.  Shelters.  Dorms.  Hospitals.  Nursing homes.  Places where there are many birds or bats.  What are bedbug bites like? A bedbug bite leaves a small red bump with a darker red dot in the middle. The bump may show up soon after a person is bitten or a day or more later. Bedbug bites usually do not hurt, but they may itch. Most people do not need treatment for bedbug bites. The bumps usually go away on their own in a few days. How do I check for bedbugs? Bedbugs are reddish-brown, oval, and flat. They are very small and  they cannot fly. Look for bedbugs in these places:  On mattresses, bed frames, headboards, and box springs.  On drapes and curtains in bedrooms.  Under the carpet in bedrooms.  Behind electrical outlets.  Behind any wallpaper that is peeling.  Inside luggage.  Also look for black or red spots or stains on or near the bed. What should I do if I find bedbugs? When Traveling Check your clothes, suitcase, and belongings for bedbugs before you go back home. You may want to throw away anything that has bedbugs on it. At Home Your bedroom may need to be treated by a pest control  expert. You may also need to throw away mattresses or luggage. To help keep bedbugs from coming back, you may want to:  Put a plastic cover over your mattress.  Wash your clothes and bedding in water that is hotter than 120F (48.9C). Dry them on a hot setting.  Vacuum often around the bed and in all of the cracks where the bugs might hide.  Check all used furniture, bedding, or clothes that you bring into your home.  Get rid of bird nests and bat roosts that are near your home.  In Your Bed Try wearing pajamas that have long sleeves and pant legs. Bedbugs usually bite areas of the skin that are not covered. This information is not intended to replace advice given to you by your health care provider. Make sure you discuss any questions you have with your health care provider. Document Released: 03/31/2011 Document Revised: 05/21/2016 Document Reviewed: 12/10/2014 Elsevier Interactive Patient Education  2018 [place], Adult Scabies is a skin condition that happens when very small insects get under the skin (infestation). This causes a rash and severe itchiness. Scabies can spread from person to person (is contagious). If you get scabies, it is common for others in your household to get scabies too. With proper treatment, symptoms usually go away in 2-4 weeks. Scabies usually does not cause lasting problems. What are the causes? This condition is caused by mites (Sarcoptes scabiei, or human itch mites) that can only be seen with a microscope. The mites get into the top layer of skin and lay eggs. Scabies can spread from person to person through:  Close contact with a person who has scabies.  Contact with infested items, such as towels, bedding, or clothing.  What increases the risk? This condition is more likely to develop in:  People who live in nursing homes and other extended-care facilities.  People who have sexual contact with a partner who has scabies.  Young  children who attend child care facilities.  People who care for others who are at increased risk for scabies.  What are the signs or symptoms? Symptoms of this condition may include:  Severe itchiness. This is often worse at night.  A rash that includes tiny red bumps or blisters. The rash commonly occurs on the wrist, elbow, armpit, fingers, waist, groin, or buttocks. Bumps may form a line (burrow) in some areas.  Skin irritation. This can include scaly patches or sores.  How is this diagnosed? This condition is diagnosed with a physical exam. Your health care provider will look closely at your skin. In some cases, your health care provider may take a sample of your affected skin (skin scraping) and have it examined under a microscope. How is this treated? This condition may be treated with:  Medicated cream or lotion that kills the mites. This is spread  on the entire body and left on for several hours. Usually, one treatment with medicated cream or lotion is enough to kill all of the mites. In severe cases, the treatment may be repeated.  Medicated cream that relieves itching.  Medicines that help to relieve itching.  Medicines that kill the mites. This treatment is rarely used.  Follow these instructions at home:  Medicines  Take or apply over-the-counter and prescription medicines as told by your health care provider.  Apply medicated cream or lotion as told by your health care provider.  Do not wash off the medicated cream or lotion until the necessary amount of time has passed. Skin Care  Avoid scratching your affected skin.  Keep your fingernails closely trimmed to reduce injury from scratching.  Take cool baths or apply cool washcloths to help reduce itching. General instructions  Clean all items that you recently had contact with, including bedding, clothing, and furniture. Do this on the same day that your treatment starts. ? Use hot water when you wash  items. ? Place unwashable items into closed, airtight plastic bags for at least 3 days. The mites cannot live for more than 3 days away from human skin. ? Vacuum furniture and mattresses that you use.  Make sure that other people who may have been infested are examined by a health care provider. These include members of your household and anyone who may have had contact with infested items.  Keep all follow-up visits as told by your health care provider. This is important. Contact a health care provider if:  You have itching that does not go away after 4 weeks of treatment.  You continue to develop new bumps or burrows.  You have redness, swelling, or pain in your rash area after treatment.  You have fluid, blood, or pus coming from your rash. This information is not intended to replace advice given to you by your health care provider. Make sure you discuss any questions you have with your health care provider. Document Released: 09/04/2015 Document Revised: 05/21/2016 Document Reviewed: 07/16/2015 Elsevier Interactive Patient Education  [employer].

## 2018-10-03 NOTE — Progress Notes (Signed)
  Subjective:    Benjamin Doyle is a 22 y.o. 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  Another person was also getting rash often and his father.  There is 4 all together.  Initially it was around waist.  Now it is more on legs and arm and very itchy.  On and off hives months.  He is currently back in school and had another bout of hives and rash on thigh that had multiple raised spots in a circular fashion.   Has been getting hives on and off for 2 months.  Denies fevers, HA, v/d, lethargy.   The following portions of the patient's history were reviewed and updated as appropriate: allergies, current medications, past family history, past medical history, past social history, past surgical history and problem list.  Review of Systems Pertinent items are noted in HPI.   Allergies: No Known Allergies   Current Outpatient Medications on File Prior to Visit  Medication Sig Dispense Refill  . albuterol (PROVENTIL HFA;VENTOLIN HFA) 108 (90 BASE) MCG/ACT inhaler Inhale 2 puffs into the lungs every 6 (six) hours as needed for wheezing or shortness of breath. 2 Inhaler 3   No current facility-administered medications on file prior to visit.     History and Problem List: Past Medical History:  Diagnosis Date  . Asthma   . Heart murmur         Objective:    Wt 189 lb 4.8 oz (85.9 kg)   BMI 23.66 kg/m   General: alert, active, cooperative, non toxic Lungs: clear to auscultation, no wheeze, crackles or retractions Heart: RRR, Nl S1, S2, no murmurs Abd: soft, non tender, non distended, normal BS, no organomegaly, no masses appreciated Skin: mild erythema on right thigh with excoriations, mild excoriations on legs with healing small scabs Neuro: normal mental status, No focal deficits  No results found for this or any previous visit (from the past 72 hour(s)).      Assessment:   Benjamin Doyle is a 22 y.o. old male with  1. Rash and other nonspecific skin eruption   2. Pruritus   3. Influenza vaccination administered at current visit     Plan:   1.  Will treat for potential scabies with refill if new spots in 2 weeks.  Consider bed bugs as cause and discussed ways to evaluate and given information on both conditions.  Start zyrtec daily for hive like reactions.  Hydrocortisone to areas that are itchy bid prn.   --administered flu shot at visit.   --Indications, contraindications and side effects of vaccine/vaccines discussed with parent and parent verbally expressed understanding and also agreed with the administration of vaccine/vaccines as ordered above  today.     Meds ordered this encounter  Medications  . permethrin (ELIMITE) 5 % cream    Sig: Apply 1 application topically once for 1 dose. Apply toe to neck and leave on 8-14hrs and wash off.    Dispense:  60 g    Refill:  1  . cetirizine (ZYRTEC) 10 MG tablet    Sig: Take 1 tablet (10 mg total) by mouth daily.    Dispense:  30 tablet    Refill:  2       Return if symptoms worsen or fail to improve. in 2-3 days or prior for concerns  Kristen Loader, DO

## 2018-10-05 DIAGNOSIS — Z23 Encounter for immunization: Secondary | ICD-10-CM | POA: Diagnosis not present

## 2018-10-05 NOTE — Addendum Note (Signed)
Addended by: Ander Gaster on: 10/05/2018 11:24 AM   Modules accepted: Orders

## 2022-09-21 ENCOUNTER — Other Ambulatory Visit (HOSPITAL_COMMUNITY)
Admission: RE | Admit: 2022-09-21 | Discharge: 2022-09-21 | Disposition: A | Discharge status: Home / Self Care | Payer: BC Managed Care – PPO | Source: Ambulatory Visit | Attending: Otolaryngology | Admitting: Otolaryngology

## 2022-09-21 DIAGNOSIS — R590 Localized enlarged lymph nodes: Secondary | ICD-10-CM | POA: Insufficient documentation

## 2022-09-28 LAB — SURGICAL PATHOLOGY

## 2022-10-01 ENCOUNTER — Telehealth: Payer: Self-pay | Admitting: Hematology

## 2022-10-01 NOTE — Telephone Encounter (Signed)
Scheduled appt per 10/5 referral. Pt's mother is aware of appt date and time. Pt's mother is aware to arrive 15 mins prior to appt time and to bring and updated insurance card. Pt's mother is aware of appt location.

## 2022-10-02 ENCOUNTER — Inpatient Hospital Stay: Payer: BC Managed Care – PPO

## 2022-10-02 ENCOUNTER — Inpatient Hospital Stay: Payer: BC Managed Care – PPO | Attending: Hematology | Admitting: Hematology

## 2022-10-02 ENCOUNTER — Encounter: Payer: Self-pay | Admitting: Hematology

## 2022-10-02 ENCOUNTER — Other Ambulatory Visit: Payer: Self-pay

## 2022-10-02 VITALS — BP 130/78 | HR 76 | Temp 98.2°F | Resp 16 | Wt 186.2 lb

## 2022-10-02 DIAGNOSIS — C8108 Nodular lymphocyte predominant Hodgkin lymphoma, lymph nodes of multiple sites: Secondary | ICD-10-CM | POA: Insufficient documentation

## 2022-10-02 LAB — CMP (CANCER CENTER ONLY)
ALT: 21 U/L (ref 0–44)
AST: 18 U/L (ref 15–41)
Albumin: 4.5 g/dL (ref 3.5–5.0)
Alkaline Phosphatase: 76 U/L (ref 38–126)
Anion gap: 6 (ref 5–15)
BUN: 18 mg/dL (ref 6–20)
CO2: 31 mmol/L (ref 22–32)
Calcium: 9.5 mg/dL (ref 8.9–10.3)
Chloride: 104 mmol/L (ref 98–111)
Creatinine: 0.88 mg/dL (ref 0.61–1.24)
GFR, Estimated: >60 mL/min (ref >60)
Glucose, Bld: 93 mg/dL (ref 70–99)
Potassium: 3.8 mmol/L (ref 3.5–5.1)
Sodium: 141 mmol/L (ref 135–145)
Total Bilirubin: 0.6 mg/dL (ref 0.3–1.2)
Total Protein: 8 g/dL (ref 6.5–8.1)

## 2022-10-02 LAB — CBC WITH DIFFERENTIAL (CANCER CENTER ONLY)
Abs Immature Granulocytes: 0.02 10*3/uL (ref 0.00–0.07)
Basophils Absolute: 0 10*3/uL (ref 0.0–0.1)
Basophils Relative: 0 %
Eosinophils Absolute: 0.2 10*3/uL (ref 0.0–0.5)
Eosinophils Relative: 3 %
HCT: 44.1 % (ref 39.0–52.0)
Hemoglobin: 15 g/dL (ref 13.0–17.0)
Immature Granulocytes: 0 %
Lymphocytes Relative: 32 %
Lymphs Abs: 1.9 10*3/uL (ref 0.7–4.0)
MCH: 28.6 pg (ref 26.0–34.0)
MCHC: 34 g/dL (ref 30.0–36.0)
MCV: 84 fL (ref 80.0–100.0)
Monocytes Absolute: 0.5 10*3/uL (ref 0.1–1.0)
Monocytes Relative: 8 %
Neutro Abs: 3.2 10*3/uL (ref 1.7–7.7)
Neutrophils Relative %: 57 %
Platelet Count: 312 10*3/uL (ref 150–400)
RBC: 5.25 MIL/uL (ref 4.22–5.81)
RDW: 13.2 % (ref 11.5–15.5)
WBC Count: 5.8 10*3/uL (ref 4.0–10.5)
nRBC: 0 % (ref 0.0–0.2)

## 2022-10-02 LAB — VITAMIN D 25 HYDROXY (VIT D DEFICIENCY, FRACTURES): Vit D, 25-Hydroxy: 13.45 ng/mL — ABNORMAL LOW (ref 30–100)

## 2022-10-02 LAB — LACTATE DEHYDROGENASE: LDH: 146 U/L (ref 98–192)

## 2022-10-02 LAB — HIV ANTIBODY (ROUTINE TESTING W REFLEX): HIV Screen 4th Generation wRfx: NONREACTIVE

## 2022-10-02 LAB — HEPATITIS B SURFACE ANTIGEN: Hepatitis B Surface Ag: NONREACTIVE

## 2022-10-02 LAB — HEPATITIS B CORE ANTIBODY, TOTAL: Hep B Core Total Ab: NONREACTIVE

## 2022-10-02 LAB — SEDIMENTATION RATE: Sed Rate: 3 mm/hr (ref 0–16)

## 2022-10-02 LAB — HEPATITIS C ANTIBODY: HCV Ab: NONREACTIVE

## 2022-10-02 NOTE — Progress Notes (Signed)
HEMATOLOGY/ONCOLOGY CONSULTATION NOTE  Date of Service: 10/02/2022  Patient Care Team: Marcha Solders, MD as PCP - General  CHIEF COMPLAINTS/PURPOSE OF CONSULTATION:  Newly diagnosed nodular lymphocyte predominant Hodgkin's lymphoma  HISTORY OF PRESENTING ILLNESS:   Benjamin Doyle is a wonderful 26 y.o. male who has been referred to [place] by Dr Melida Quitter for evaluation and management of newly diagnosed nodular lymphocyte predominant Hodgkin's lymphoma.  Patient has no significant chronic medical issues and was seen by Dr. Melida Quitter for initial consultation on 06/04/2022 with increasing size of left upper neck lymph nodes. He notes that he has had enlarged lymph nodes in his neck on and off all the time in his neck and groin as well as under the armpits for the last 5+ years but recently the left neck lymph nodes were getting larger since about April 2023. Patient notes that he has been aggressively working out and did lose some weight on account of dietary changes and extensive physical exercise regimen and feels that this could have also made his lymph nodes feel more prominent.  Of note he did have a CT of the maxillofacial region with Dr. Leonard Schwartz MD in October 2017 which showed mild bilateral posterior cervical lymphadenopathy as well as significantly enlarged left submandibular adenopathy.  The largest lymph node measured 17 x 11 mm in size in the left submandibular region. this may be inflammatory in etiology but neoplasm cannot be ruled out.  After seeing Dr. Melida Quitter the patient had a CT of the neck on 06/11/2022 which showed a cluster of homogeneously enlarged lymph nodes in the left neck with the largest being in the submandibular space at about 3.2 cm.  Lymphoma or granulomatous disease are the leading considerations.  Patient eventually had a surgical biopsy of his left submandibular lymph node on 09/21/2022 with Dr. Melida Quitter which showed nodular lymphocyte  predominant Hodgkin's lymphoma.  Patient notes weight loss of about 10-15 pounds over the last couple of years though this is in the context of dietary changes and heavy physical exercise.  No significant new fatigue.  No unexplained fevers or chills.  No drenching night sweats. Acute chest pain or shortness of breath. No abdominal pain or distention. No focal bone pains or generalized bone pains. Skin rashes or pruritus. Denies any concerning unsafe sexual exposure or body fluid exposure. Denies use of IV drugs.  MEDICAL HISTORY:  Past Medical History:  Diagnosis Date   Asthma    Heart murmur     SURGICAL HISTORY: Past Surgical History:  Procedure Laterality Date   TYMPANOSTOMY TUBE PLACEMENT      SOCIAL HISTORY: Social History   Socioeconomic History   Marital status: Single    Spouse name: Not on file   Number of children: Not on file   Years of education: Not on file   Highest education level: Not on file  Occupational History   Not on file  Tobacco Use   Smoking status: Never   Smokeless tobacco: Never  Substance and Sexual Activity   Alcohol use: No   Drug use: No   Sexual activity: Not on file  Other Topics Concern   Not on file  Social History Narrative   Not on file   Social Determinants of Health   Financial Resource Strain: Not on file  Food Insecurity: Not on file  Transportation Needs: Not on file  Physical Activity: Not on file  Stress: Not on file  Social Connections: Not  on file  Intimate Partner Violence: Not on file  Notes no cigarette smoking or vaping. Minimal social alcohol use. No IV drug use.  FAMILY HISTORY: Family History  Problem Relation Age of Onset   Hypertension Mother    Hyperlipidemia Father    Hypertension Father    Hypertension Maternal Grandmother    Diabetes Maternal Grandfather    Birth defects Paternal Grandmother    Hyperlipidemia Paternal Grandmother    Hypertension Paternal Grandmother    Hypertension  Paternal Grandfather    Alcohol abuse Neg Hx    Arthritis Neg Hx    Asthma Neg Hx    Cancer Neg Hx    COPD Neg Hx    Depression Neg Hx    Drug abuse Neg Hx    Early death Neg Hx    Hearing loss Neg Hx    Heart disease Neg Hx    Kidney disease Neg Hx    Learning disabilities Neg Hx    Mental illness Neg Hx    Mental retardation Neg Hx    Miscarriages / Stillbirths Neg Hx    Stroke Neg Hx    Vision loss Neg Hx    Varicose Veins Neg Hx     ALLERGIES:  has No Known Allergies.  MEDICATIONS:  Current Outpatient Medications  Medication Sig Dispense Refill   albuterol (PROVENTIL HFA;VENTOLIN HFA) 108 (90 BASE) MCG/ACT inhaler Inhale 2 puffs into the lungs every 6 (six) hours as needed for wheezing or shortness of breath. 2 Inhaler 3   cetirizine (ZYRTEC) 10 MG tablet Take 1 tablet (10 mg total) by mouth daily. 30 tablet 2   No current facility-administered medications for this visit.    REVIEW OF SYSTEMS:    10 Point review of Systems was done is negative except as noted above.  PHYSICAL EXAMINATION: ECOG PERFORMANCE STATUS: 1 - Symptomatic but completely ambulatory  . Vitals:   10/02/22 1117  BP: 130/78  Pulse: 76  Resp: 16  Temp: 98.2 F (36.8 C)  SpO2: 100%   Filed Weights   10/02/22 1117  Weight: 186 lb 3.2 oz (84.5 kg)   .Body mass index is 23.27 kg/m.  GENERAL:alert, in no acute distress and comfortable SKIN: no acute rashes, no significant lesions EYES: conjunctiva are pink and non-injected, sclera anicteric OROPHARYNX: MMM, no exudates, no oropharyngeal erythema or ulceration NECK: supple, no JVD left neck healing surgical scar from recent biopsy. LYMPH: No lymphadenopathy bilateral posterior neck and left submandibular regions, right epitrochlear, bilateral inguinal regions. LUNGS: clear to auscultation b/l with normal respiratory effort HEART: regular rate & rhythm ABDOMEN:  normoactive bowel sounds , non tender, not distended.  No palpable  hepatosplenomegaly Extremity: no pedal edema PSYCH: alert & oriented x 3 with fluent speech NEURO: no focal motor/sensory deficits  LABORATORY DATA:  I have reviewed the data as listed  .    Latest Ref Rng & Units 12/23/2016    3:12 PM 02/16/2012    4:44 PM  CBC  WBC 4.5 - 13.0 K/uL 4.5  5.7   Hemoglobin 12.0 - 16.9 g/dL 15.4  13.2   Hematocrit 36.0 - 49.0 % 45.4  39.4   Platelets 140 - 400 K/uL 273  256     .    Latest Ref Rng & Units 02/16/2012    4:44 PM  CMP  Glucose 70 - 99 mg/dL 79   BUN 6 - 23 mg/dL 15   Creatinine 0.10 - 1.20 mg/dL 0.57   Sodium 135 -  145 mEq/L 135   Potassium 3.5 - 5.3 mEq/L 4.0   Chloride 96 - 112 mEq/L 102   CO2 19 - 32 mEq/L 24   Calcium 8.4 - 10.5 mg/dL 9.4   Total Protein 6.0 - 8.3 g/dL 7.0   Total Bilirubin 0.3 - 1.2 mg/dL 0.6   Alkaline Phos 74 - 390 U/L 230   AST 0 - 37 U/L 22   ALT 0 - 53 U/L 17       RADIOGRAPHIC STUDIES: I have personally reviewed the radiological images as listed and agreed with the findings in the report. CT neck 06/11/2022  IMPRESSION:  Cluster of homogeneously enlarged lymph nodes in the left neck,  largest being in the submandibular space at up to 3.2 cm. Lymphoma  or granulomatous disease are the leading considerations.    Electronically Signed    By: Jorje Guild M.D.    On: 06/11/2022 22:34  ASSESSMENT & PLAN:   26 year old very pleasant African-American gentleman with  1) Newly diagnosed nodular lymphocyte predominant Hodgkin's lymphoma. Patient seems to have had lymphadenopathy for several years based on his symptoms as well as the CT of the maxillofacial region that did show left submandibular lymphadenopathy even in 2017. This would be in keeping with the slow progression of nodular lymphocyte predominant Hodgkin's lymphoma. PLAN -Patient's H&P was reviewed in detail. -We discussed all available scans. -We discussed in detail his pathology results showing nodular lymphocyte predominant  Hodgkin's lymphoma. -We went over in detail the diagnosis, natural history, prognosis, treatment rationale and considerations treatment outcomes in detail with the patient and his parents. -He was given additional educational material to read and copies of his relevant findings. -It is quite likely that he has more extensive disease since it appears that he has had lymphadenopathy for a while.   -We shall get labs today to evaluate for any cytopenias and other associated infectious issues like chronic hep C or HIV. -PET CT scan for initial staging work-up of his newly diagnosed nodular lymphocyte predominant Hodgkin's lymphoma. -We discussed the broad treatment approaches that will vary based on the stage of his disease and the goal of the treatment. -We did gust that he will likely need to consider fertility preservation with sperm banking if he were to take multi diet chemoimmunotherapy approach like R-CHOP. -He will likely need an echocardiogram for cardiac function evaluation if R-CHOP is considered.  . Orders Placed This Encounter  Procedures   NM PET Image Initial (PI) Skull Base To Thigh    Standing Status:   Future    Standing Expiration Date:   10/02/2023    Order Specific Question:   If indicated for the ordered procedure, I authorize the administration of a radiopharmaceutical per Radiology protocol    Answer:   Yes    Order Specific Question:   Preferred imaging location?    Answer:   [place]   CBC with Differential (Cancer Center Only)    Standing Status:   Future    Number of Occurrences:   1    Standing Expiration Date:   10/03/2023   CMP ([place] only)    Standing Status:   Future    Number of Occurrences:   1    Standing Expiration Date:   10/03/2023   Lactate dehydrogenase    Standing Status:   Future    Number of Occurrences:   1    Standing Expiration Date:   10/03/2023   Sedimentation rate  Standing Status:   Future    Number of Occurrences:   1     Standing Expiration Date:   10/03/2023   Hepatitis C antibody    Standing Status:   Future    Number of Occurrences:   1    Standing Expiration Date:   10/03/2023   Hepatitis B surface antigen    Standing Status:   Future    Number of Occurrences:   1    Standing Expiration Date:   10/02/2023   Hepatitis B core antibody, total    Standing Status:   Future    Number of Occurrences:   1    Standing Expiration Date:   10/02/2023   RPR    Standing Status:   Future    Number of Occurrences:   1    Standing Expiration Date:   10/02/2023   HIV Antibody (routine testing w rflx)    Standing Status:   Future    Number of Occurrences:   1    Standing Expiration Date:   10/03/2023   Vitamin D 25 hydroxy    Standing Status:   Future    Number of Occurrences:   1    Standing Expiration Date:   10/02/2023   The total time spent in the appointment was 75 minutes*.  All of the patient's questions were answered with apparent satisfaction. The patient knows to call the clinic with any problems, questions or concerns.   Sullivan Lone MD MS AAHIVMS [facility] Hematology/Oncology Physician [facility]  .*Total Encounter Time as defined by the Centers for Medicare and Medicaid Services includes, in addition to the face-to-face time of a patient visit (documented in the note above) non-face-to-face time: obtaining and reviewing outside history, ordering and reviewing medications, tests or procedures, care coordination (communications with other health care professionals or caregivers) and documentation in the medical record.   10/02/2022 11:36 AM

## 2022-10-03 LAB — RPR: RPR Ser Ql: NONREACTIVE

## 2022-10-08 MED ORDER — ERGOCALCIFEROL 1.25 MG (50000 UT) PO CAPS: 50000.0000 [IU] | ORAL_CAPSULE | ORAL | 1 refills | Status: DC

## 2022-10-13 ENCOUNTER — Telehealth: Payer: Self-pay

## 2022-10-13 NOTE — Telephone Encounter (Signed)
Contacted pt per Dr Irene Limbo; to let patient know he is severely vitamin D deficient and  have sent a prescription for high-dose vitamin D replacement weekly to his pharmacy.  Other labs currently are stable.  Awaiting PET/CT scan which we shall discuss on follow-up alongside treatment options Gave pt Central scheduling number to schedule PET scan. Pt acknowledged information and verbalized understanding.

## 2022-10-20 ENCOUNTER — Ambulatory Visit: Payer: BC Managed Care – PPO | Admitting: Hematology

## 2022-10-22 ENCOUNTER — Encounter (HOSPITAL_COMMUNITY)
Admission: RE | Admit: 2022-10-22 | Discharge: 2022-10-22 | Disposition: A | Discharge status: Home / Self Care | Payer: BC Managed Care – PPO | Source: Ambulatory Visit | Attending: Hematology | Admitting: Hematology

## 2022-10-22 DIAGNOSIS — C8108 Nodular lymphocyte predominant Hodgkin lymphoma, lymph nodes of multiple sites: Secondary | ICD-10-CM | POA: Diagnosis present

## 2022-10-22 MED ORDER — FLUDEOXYGLUCOSE F - 18 (FDG) INJECTION
9.8200 | Freq: Once | INTRAVENOUS | Status: AC | PRN
  Administered 2022-10-22: 9.82 via INTRAVENOUS

## 2022-10-26 ENCOUNTER — Other Ambulatory Visit: Payer: Self-pay

## 2022-10-26 ENCOUNTER — Inpatient Hospital Stay: Payer: BC Managed Care – PPO | Attending: Hematology | Admitting: Hematology

## 2022-10-26 VITALS — BP 132/76 | HR 90 | Temp 97.5°F | Resp 16 | Wt 196.1 lb

## 2022-10-26 DIAGNOSIS — C8101 Nodular lymphocyte predominant Hodgkin lymphoma, lymph nodes of head, face, and neck: Secondary | ICD-10-CM | POA: Diagnosis present

## 2022-10-26 DIAGNOSIS — C8108 Nodular lymphocyte predominant Hodgkin lymphoma, lymph nodes of multiple sites: Secondary | ICD-10-CM | POA: Diagnosis not present

## 2022-10-26 NOTE — Progress Notes (Signed)
HEMATOLOGY/ONCOLOGY CLINIC NOTE  Date of Service: 10/26/2022  Patient Care Team: Marcha Solders, MD as PCP - General  CHIEF COMPLAINTS:  Nodular lymphocyte predominant Hodgkin's lymphoma  HISTORY OF PRESENTING ILLNESS:   Benjamin Doyle is a wonderful 26 y.o. male who has been referred to [place] by Dr Melida Quitter for evaluation and management of newly diagnosed nodular lymphocyte predominant Hodgkin's lymphoma.  Patient has no significant chronic medical issues and was seen by Dr. Melida Quitter for initial consultation on 06/04/2022 with increasing size of left upper neck lymph nodes. He notes that he has had enlarged lymph nodes in his neck on and off all the time in his neck and groin as well as under the armpits for the last 5+ years but recently the left neck lymph nodes were getting larger since about April 2023. Patient notes that he has been aggressively working out and did lose some weight on account of dietary changes and extensive physical exercise regimen and feels that this could have also made his lymph nodes feel more prominent.  Of note he did have a CT of the maxillofacial region with Dr. Leonard Schwartz MD in October 2017 which showed mild bilateral posterior cervical lymphadenopathy as well as significantly enlarged left submandibular adenopathy.  The largest lymph node measured 17 x 11 mm in size in the left submandibular region. this may be inflammatory in etiology but neoplasm cannot be ruled out.  After seeing Dr. Melida Quitter the patient had a CT of the neck on 06/11/2022 which showed a cluster of homogeneously enlarged lymph nodes in the left neck with the largest being in the submandibular space at about 3.2 cm.  Lymphoma or granulomatous disease are the leading considerations.  Patient eventually had a surgical biopsy of his left submandibular lymph node on 09/21/2022 with Dr. Melida Quitter which showed nodular lymphocyte predominant Hodgkin's lymphoma.  Patient  notes weight loss of about 10-15 pounds over the last couple of years though this is in the context of dietary changes and heavy physical exercise.  No significant new fatigue.  No unexplained fevers or chills.  No drenching night sweats. Acute chest pain or shortness of breath. No abdominal pain or distention. No focal bone pains or generalized bone pains. Skin rashes or pruritus. Denies any concerning unsafe sexual exposure or body fluid exposure. Denies use of IV drugs.   INTERVAL HISTORY:  Benjamin Doyle is a 26 y.o. male who is here today and accompanied by his parents for continued evaluation and management of nodular lymphocyte predominant Hodgkin's lymphoma. He was last seen by me for consult on 10/02/2022.  Today, he states that he has been doing well since his last visit with no new concerns.   He denies any physical limitations. He denies any fever, chills, drenching night sweats or unexplained weight loss. He denies any history of frequent infections.  He denies any rapid change in the size of his cervical lymph nodes.  MEDICAL HISTORY:  Past Medical History:  Diagnosis Date   Asthma    Heart murmur     SURGICAL HISTORY: Past Surgical History:  Procedure Laterality Date   TYMPANOSTOMY TUBE PLACEMENT      SOCIAL HISTORY: Social History   Socioeconomic History   Marital status: Single    Spouse name: Not on file   Number of children: Not on file   Years of education: Not on file   Highest education level: Not on file  Occupational History  Not on file  Tobacco Use   Smoking status: Never   Smokeless tobacco: Never  Substance and Sexual Activity   Alcohol use: No   Drug use: No   Sexual activity: Not on file  Other Topics Concern   Not on file  Social History Narrative   Not on file   Social Determinants of Health   Financial Resource Strain: Not on file  Food Insecurity: Not on file  Transportation Needs: Not on file  Physical Activity: Not  on file  Stress: Not on file  Social Connections: Not on file  Intimate Partner Violence: Not on file  Notes no cigarette smoking or vaping. Minimal social alcohol use. No IV drug use.  FAMILY HISTORY: Family History  Problem Relation Age of Onset   Hypertension Mother    Hyperlipidemia Father    Hypertension Father    Hypertension Maternal Grandmother    Diabetes Maternal Grandfather    Birth defects Paternal Grandmother    Hyperlipidemia Paternal Grandmother    Hypertension Paternal Grandmother    Hypertension Paternal Grandfather    Alcohol abuse Neg Hx    Arthritis Neg Hx    Asthma Neg Hx    Cancer Neg Hx    COPD Neg Hx    Depression Neg Hx    Drug abuse Neg Hx    Early death Neg Hx    Hearing loss Neg Hx    Heart disease Neg Hx    Kidney disease Neg Hx    Learning disabilities Neg Hx    Mental illness Neg Hx    Mental retardation Neg Hx    Miscarriages / Stillbirths Neg Hx    Stroke Neg Hx    Vision loss Neg Hx    Varicose Veins Neg Hx     ALLERGIES:  has No Known Allergies.  MEDICATIONS:  Current Outpatient Medications  Medication Sig Dispense Refill   albuterol (PROVENTIL HFA;VENTOLIN HFA) 108 (90 BASE) MCG/ACT inhaler Inhale 2 puffs into the lungs every 6 (six) hours as needed for wheezing or shortness of breath. 2 Inhaler 3   cetirizine (ZYRTEC) 10 MG tablet Take 1 tablet (10 mg total) by mouth daily. 30 tablet 2   ergocalciferol (VITAMIN D2) 1.25 MG (50000 UT) capsule Take 1 capsule (50,000 Units total) by mouth once a week. 12 capsule 1   No current facility-administered medications for this visit.    REVIEW OF SYSTEMS:    10 Point review of Systems was done is negative except as noted above.  PHYSICAL EXAMINATION: ECOG PERFORMANCE STATUS: 1 - Symptomatic but completely ambulatory  . Vitals:   10/26/22 1539  BP: 132/76  Pulse: 90  Resp: 16  Temp: (!) 97.5 F (36.4 C)  SpO2: 99%    Filed Weights   10/26/22 1539  Weight: 196 lb 1.6 oz  (89 kg)    .Body mass index is 24.51 kg/m.  GENERAL:alert, in no acute distress and comfortable SKIN: no acute rashes, no significant lesions EYES: conjunctiva are pink and non-injected, sclera anicteric OROPHARYNX: MMM, no exudates, no oropharyngeal erythema or ulceration NECK: supple, no JVD, left neck well healed surgical scar from biopsy. LYMPH:  lymphadenopathy bilateral posterior neck and left submandibular regions, right epitrochlear, bilateral inguinal regions. LUNGS: clear to auscultation b/l with normal respiratory effort HEART: regular rate & rhythm ABDOMEN:  normoactive bowel sounds , non tender, not distended.  No palpable hepatosplenomegaly Extremity: no pedal edema PSYCH: alert & oriented x 3 with fluent speech NEURO: no focal  motor/sensory deficits  LABORATORY DATA:  I have reviewed the data as listed     Latest Ref Rng & Units 10/02/2022    1:05 PM 12/23/2016    3:12 PM 02/16/2012    4:44 PM  CBC  WBC 4.0 - 10.5 K/uL 5.8  4.5  5.7   Hemoglobin 13.0 - 17.0 g/dL 15.0  15.4  13.2   Hematocrit 39.0 - 52.0 % 44.1  45.4  39.4   Platelets 150 - 400 K/uL 312  273  256       Latest Ref Rng & Units 10/02/2022    1:05 PM 02/16/2012    4:44 PM  CMP  Glucose 70 - 99 mg/dL 93  79   BUN 6 - 20 mg/dL 18  15   Creatinine 0.61 - 1.24 mg/dL 0.88  0.57   Sodium 135 - 145 mmol/L 141  135   Potassium 3.5 - 5.1 mmol/L 3.8  4.0   Chloride 98 - 111 mmol/L 104  102   CO2 22 - 32 mmol/L 31  24   Calcium 8.9 - 10.3 mg/dL 9.5  9.4   Total Protein 6.5 - 8.1 g/dL 8.0  7.0   Total Bilirubin 0.3 - 1.2 mg/dL 0.6  0.6   Alkaline Phos 38 - 126 U/L 76  230   AST 15 - 41 U/L 18  22   ALT 0 - 44 U/L 21  17        Latest Ref Rng & Units 10/02/2022    1:05 PM  Hepatitis  Hep B Surface Ag NON REACTIVE NON REACTIVE   Hep C Ab NON REACTIVE NON REACTIVE    HIV antibody 10/02/2022: Non-reactive RPR 10/02/2022: Non-reactive  Last vitamin D Lab Results  Component Value Date   VD25OH 13.45  (L) 10/02/2022    Lab Results  Component Value Date   ESRSEDRATE 3 10/02/2022    LDH 10/02/2022: 146   RADIOGRAPHIC STUDIES: I have personally reviewed the radiological images as listed and agreed with the findings in the report.  NM PET Image Initial (PI) Skull Base To Thigh Result Date: 10/26/2022 CLINICAL DATA:  Initial treatment strategy for Hodgkin's lymphoma. EXAM: NUCLEAR MEDICINE PET SKULL BASE TO THIGH TECHNIQUE: 9.82 mCi F-18 FDG was injected intravenously. Full-ring PET imaging was performed from the skull base to thigh after the radiotracer. CT data was obtained and used for attenuation correction and anatomic localization. Fasting blood glucose: 88 mg/dl COMPARISON:  CT neck June 11, 2022 FINDINGS: Mediastinal blood pool activity: SUV max 1.6 Liver activity: SUV max 2.7 NECK: Left-greater-than-right hypermetabolic cervical adenopathy. For reference: -left level 2b lymph node measures 19 x 10 mm on image 74/3 with a max SUV of 5.6. -right level 2b lymph node measures 22 x 14 mm on image 63/3 with a max SUV of 6.0 Hypermetabolic hyperplasia of the tonsils with a max SUV of 17.4. Incidental CT findings: None. CHEST: No hypermetabolic thoracic adenopathy. No hypermetabolic pulmonary nodules or masses. Physiologic metabolic activity in the thyroid remnant with a max SUV of 3.01. Incidental CT findings: None. ABDOMEN/PELVIS: Hypermetabolic bilateral pelvic sidewall and iliac side chain and inguinal lymph nodes. For reference: -Hypermetabolic right pelvic sidewall lymph node measures 2.5 x 0.9 cm on image 290/3 with a max SUV of 82.4. -Hypermetabolic left pelvic sidewall lymph node measures 2.2 x 1.1 cm on image 289/3 with a max SUV of 6.8. No abnormal hypermetabolic activity in the liver or spleen. Hypermetabolic descending and sigmoid colonic activity is  favored physiologic. Incidental CT findings: No splenomegaly. SKELETON: No focal hypermetabolic activity to suggest skeletal metastasis.  Incidental CT findings: None. IMPRESSION: 1. Hypermetabolic cervical, pelvic and inguinal adenopathy compatible with patient's known diagnosis of Hodgkin's lymphoma. 2. Hypermetabolic hyperplasia of the tonsils, likely reflects lymphomatous disease involvement. 3. No splenomegaly. Electronically Signed   By: Dahlia Bailiff M.D.   On: 10/26/2022 08:55    CT neck 06/11/2022 IMPRESSION:  Cluster of homogeneously enlarged lymph nodes in the left neck, largest being in the submandibular space at up to 3.2 cm. Lymphoma or granulomatous disease are the leading considerations.   CT Maxillofacial 19/28/2017 IMPRESSION: Mild bilateral maxillary sinusitis. Mild bilateral posterior cervical adenopathy is noted, as well as significantly enlarged left submandibular adenopathy. This may simply be inflammatory in etiology, but neoplasm cannot be excluded. Clinical correlation is recommended.  ASSESSMENT & PLAN:   26 y.o. 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 -This would be in keeping with the slow progression of nodular lymphocyte predominant Hodgkin's lymphoma. -PET CT 10/22/2022 revealed Hypermetabolic cervical, pelvic and inguinal adenopathy as well as tonsillar enlargement.   PLAN: -Thoroughly reviewed his PET CT and lab results with patient and his parents today. -Vitamin D of 13.45. Advised to start Vitamin D supplementation. -CBC, LDH, and sed rate WNL. -Negative hepatitis panel, RPR, and HIV results. -PET CT revealed Hypermetabolic cervical, pelvic and inguinal adenopathy as well as tonsillar enlargement. -We again discussed the broad treatment approaches that will vary based on the stage of his disease and the goal of the  treatment. -We discussed that he will likely need to consider fertility preservation with sperm banking if he were to take multidrug chemo-immunotherapy approach like R-CHOP. -He will likely need an echocardiogram for cardiac function evaluation if R-CHOP is considered. Alternatively we could pursue Rituxan monotherapy vs observation. -patient is leaning towards Rituxan monotherapy but would like to think about this further and let us know. -He would like to consider his options and will contact us to let us know what he decides.   FOLLOW UP:   . No orders of the defined types were placed in this encounter.  The total time spent in the appointment was 32 minutes* .  All of the patient's questions were answered with apparent satisfaction. The patient knows to call the clinic with any problems, questions or concerns.   Sullivan Lone MD MS AAHIVMS [facility] Hematology/Oncology Physician [facility]  .*Total Encounter Time as defined by the Centers for Medicare and Medicaid Services includes, in addition to the face-to-face time of a patient visit (documented in the note above) non-face-to-face time: obtaining and reviewing outside history, ordering and reviewing medications, tests or procedures, care coordination (communications with other health care professionals or caregivers) and documentation in the medical record.   I,Alexis Herring,acting as a Education administrator for Sullivan Lone, MD.,have documented all relevant documentation on the behalf of Sullivan Lone, MD,as directed by  Sullivan Lone, MD while in the presence of Sullivan Lone, MD.  .I have reviewed the above documentation for accuracy and completeness, and I agree with the above. Brunetta Genera MD

## 2022-11-02 ENCOUNTER — Other Ambulatory Visit: Payer: Self-pay | Admitting: Hematology

## 2022-11-02 DIAGNOSIS — Z7189 Other specified counseling: Secondary | ICD-10-CM | POA: Insufficient documentation

## 2022-11-02 DIAGNOSIS — C8108 Nodular lymphocyte predominant Hodgkin lymphoma, lymph nodes of multiple sites: Secondary | ICD-10-CM | POA: Insufficient documentation

## 2022-11-02 NOTE — Progress Notes (Signed)
Rituxan orders placed

## 2022-11-02 NOTE — Progress Notes (Signed)
START OFF PATHWAY REGIMEN - Other   [id number]:Rituximab IV/SUBQ D1 q7 Days:   Cycle 1: A cycle is 7 days:     Rituximab-xxxx    Cycles 2 and beyond: A cycle is every 7 days:     Rituximab and hyaluronidase human   **Always confirm dose/schedule in your pharmacy ordering system**  Patient Characteristics: Intent of Therapy: Non-Curative / Palliative Intent, Discussed with Patient

## 2022-11-04 NOTE — Progress Notes (Signed)
Pharmacist Chemotherapy Monitoring - Initial Assessment    Anticipated start date: 11/11/22   The following has been reviewed per standard work regarding the patient's treatment regimen: The patient's diagnosis, treatment plan and drug doses, and organ/hematologic function Lab orders and baseline tests specific to treatment regimen  The treatment plan start date, drug sequencing, and pre-medications Prior authorization status  Patient's documented medication list, including drug-drug interaction screen and prescriptions for anti-emetics and supportive care specific to the treatment regimen The drug concentrations, fluid compatibility, administration routes, and timing of the medications to be used The patient's access for treatment and lifetime cumulative dose history, if applicable  The patient's medication allergies and previous infusion related reactions, if applicable   Changes made to treatment plan:  N/A  Follow up needed:  N/A   Acquanetta Belling, RPH, BCPS, BCOP 11/04/2022   3:16 PM

## 2022-11-05 ENCOUNTER — Other Ambulatory Visit: Payer: Self-pay

## 2022-11-08 ENCOUNTER — Other Ambulatory Visit: Payer: Self-pay

## 2022-11-10 ENCOUNTER — Telehealth: Payer: Self-pay | Admitting: *Deleted

## 2022-11-10 ENCOUNTER — Other Ambulatory Visit: Payer: Self-pay

## 2022-11-10 ENCOUNTER — Inpatient Hospital Stay: Payer: BC Managed Care – PPO | Attending: Hematology

## 2022-11-10 DIAGNOSIS — R634 Abnormal weight loss: Secondary | ICD-10-CM | POA: Insufficient documentation

## 2022-11-10 DIAGNOSIS — C8108 Nodular lymphocyte predominant Hodgkin lymphoma, lymph nodes of multiple sites: Secondary | ICD-10-CM

## 2022-11-10 DIAGNOSIS — Z5112 Encounter for antineoplastic immunotherapy: Secondary | ICD-10-CM | POA: Insufficient documentation

## 2022-11-10 DIAGNOSIS — C8101 Nodular lymphocyte predominant Hodgkin lymphoma, lymph nodes of head, face, and neck: Secondary | ICD-10-CM | POA: Insufficient documentation

## 2022-11-10 MED ORDER — ONDANSETRON HCL 8 MG PO TABS: 8.0000 mg | ORAL_TABLET | Freq: Three times a day (TID) | ORAL | 0 refills | Status: AC | PRN

## 2022-11-10 NOTE — Telephone Encounter (Signed)
Benjamin Doyle in office fo9r education class.  Asking status of Accommodation FMLA paperwork due 11/11/2022 or looses employment per e-mail read by this nurse.  Obtained ROI and information to complete.   "Estimated start date is date biopsy performed 09/21/2022.  Have not worked since biopsy because of difficulty speaking, swallowing, turning head and more after biopsy.  Better now however my first chemotherapy is tomorrow morning.  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  Employed for eight months is why I do not yet qualify for FMLA."   Completed form to provider for review and signature returned via fax and e-mail.  Original in envelope for patient pick up tomorrow.  Copy to bin for items to be scanned completes process.  No further instructions received or actions required by this nurse.

## 2022-11-11 ENCOUNTER — Inpatient Hospital Stay: Payer: BC Managed Care – PPO

## 2022-11-11 VITALS — BP 110/72 | HR 81 | Temp 98.2°F | Resp 18 | Ht 75.0 in | Wt 194.8 lb

## 2022-11-11 DIAGNOSIS — Z5112 Encounter for antineoplastic immunotherapy: Secondary | ICD-10-CM | POA: Diagnosis not present

## 2022-11-11 DIAGNOSIS — Z7189 Other specified counseling: Secondary | ICD-10-CM

## 2022-11-11 DIAGNOSIS — C8108 Nodular lymphocyte predominant Hodgkin lymphoma, lymph nodes of multiple sites: Secondary | ICD-10-CM

## 2022-11-11 DIAGNOSIS — C8101 Nodular lymphocyte predominant Hodgkin lymphoma, lymph nodes of head, face, and neck: Secondary | ICD-10-CM | POA: Diagnosis present

## 2022-11-11 DIAGNOSIS — R634 Abnormal weight loss: Secondary | ICD-10-CM | POA: Diagnosis not present

## 2022-11-11 LAB — CMP (CANCER CENTER ONLY)
ALT: 18 U/L (ref 0–44)
AST: 24 U/L (ref 15–41)
Albumin: 4.3 g/dL (ref 3.5–5.0)
Alkaline Phosphatase: 67 U/L (ref 38–126)
Anion gap: 6 (ref 5–15)
BUN: 17 mg/dL (ref 6–20)
CO2: 28 mmol/L (ref 22–32)
Calcium: 9.8 mg/dL (ref 8.9–10.3)
Chloride: 105 mmol/L (ref 98–111)
Creatinine: 1.04 mg/dL (ref 0.61–1.24)
GFR, Estimated: >60 mL/min (ref >60)
Glucose, Bld: 77 mg/dL (ref 70–99)
Potassium: 4.3 mmol/L (ref 3.5–5.1)
Sodium: 139 mmol/L (ref 135–145)
Total Bilirubin: 0.7 mg/dL (ref 0.3–1.2)
Total Protein: 7.5 g/dL (ref 6.5–8.1)

## 2022-11-11 LAB — CBC WITH DIFFERENTIAL (CANCER CENTER ONLY)
Abs Immature Granulocytes: 0 10*3/uL (ref 0.00–0.07)
Basophils Absolute: 0 10*3/uL (ref 0.0–0.1)
Basophils Relative: 0 %
Eosinophils Absolute: 0.2 10*3/uL (ref 0.0–0.5)
Eosinophils Relative: 4 %
HCT: 42.9 % (ref 39.0–52.0)
Hemoglobin: 14.6 g/dL (ref 13.0–17.0)
Immature Granulocytes: 0 %
Lymphocytes Relative: 50 %
Lymphs Abs: 2.5 10*3/uL (ref 0.7–4.0)
MCH: 28.9 pg (ref 26.0–34.0)
MCHC: 34 g/dL (ref 30.0–36.0)
MCV: 85 fL (ref 80.0–100.0)
Monocytes Absolute: 0.4 10*3/uL (ref 0.1–1.0)
Monocytes Relative: 7 %
Neutro Abs: 2 10*3/uL (ref 1.7–7.7)
Neutrophils Relative %: 39 %
Platelet Count: 237 10*3/uL (ref 150–400)
RBC: 5.05 MIL/uL (ref 4.22–5.81)
RDW: 13 % (ref 11.5–15.5)
WBC Count: 5 10*3/uL (ref 4.0–10.5)
nRBC: 0 % (ref 0.0–0.2)

## 2022-11-11 LAB — LACTATE DEHYDROGENASE: LDH: 147 U/L (ref 98–192)

## 2022-11-11 MED ORDER — METHYLPREDNISOLONE SODIUM SUCC 125 MG IJ SOLR
125.0000 mg | Freq: Once | INTRAMUSCULAR | Status: AC
  Administered 2022-11-11: 125 mg via INTRAVENOUS
  Filled 2022-11-11: qty 2

## 2022-11-11 MED ORDER — ACETAMINOPHEN 325 MG PO TABS
650.0000 mg | ORAL_TABLET | Freq: Once | ORAL | Status: AC
  Administered 2022-11-11: 650 mg via ORAL
  Filled 2022-11-11: qty 2

## 2022-11-11 MED ORDER — FAMOTIDINE IN NACL 20-0.9 MG/50ML-% IV SOLN
20.0000 mg | Freq: Once | INTRAVENOUS | Status: AC
  Administered 2022-11-11: 20 mg via INTRAVENOUS
  Filled 2022-11-11: qty 50

## 2022-11-11 MED ORDER — SODIUM CHLORIDE 0.9 % IV SOLN
375.0000 mg/m2 | Freq: Once | INTRAVENOUS | Status: AC
  Administered 2022-11-11: 800 mg via INTRAVENOUS
  Filled 2022-11-11: qty 50

## 2022-11-11 MED ORDER — DIPHENHYDRAMINE HCL 25 MG PO CAPS
50.0000 mg | ORAL_CAPSULE | Freq: Once | ORAL | Status: AC
  Administered 2022-11-11: 50 mg via ORAL
  Filled 2022-11-11: qty 2

## 2022-11-11 MED ORDER — SODIUM CHLORIDE 0.9 % IV SOLN: Freq: Once | INTRAVENOUS | Status: AC

## 2022-11-11 NOTE — Patient Instructions (Signed)
[facility]  Discharge Instructions: Thank you for choosing [facility] to provide your oncology and hematology care.   If you have a lab appointment with the [facility], please go directly to the [facility] and check in at the registration area.   Wear comfortable clothing and clothing appropriate for easy access to any Portacath or PICC line.   We strive to give you quality time with your provider. You may need to reschedule your appointment if you arrive late (15 or more minutes).  Arriving late affects you and other patients whose appointments are after yours.  Also, if you miss three or more appointments without notifying the office, you may be dismissed from the clinic at the provider's discretion.      For prescription refill requests, have your pharmacy contact our office and allow 72 hours for refills to be completed.    Today you received the following chemotherapy and/or immunotherapy agents Rituxian   Rituximab Injection What is this medication? RITUXIMAB (ri TUX i mab) treats leukemia and lymphoma. It works by blocking a protein that causes cancer cells to grow and multiply. This helps to slow or stop the spread of cancer cells. It may also be used to treat autoimmune conditions, such as arthritis. It works by slowing down an overactive immune system. It is a monoclonal antibody. This medicine may be used for other purposes; ask your health care provider or pharmacist if you have questions. COMMON BRAND NAME(S): RIABNI, Rituxan, RUXIENCE, truxima What should I tell my care team before I take this medication? They need to know if you have any of these conditions: Chest pain Heart disease Immune system problems Infection, such as chickenpox, cold sores, hepatitis B, herpes Irregular heartbeat or rhythm Kidney disease Low blood counts, such as low white cells, platelets, red cells Lung disease Recent or upcoming vaccine An  unusual or allergic reaction to rituximab, other medications, foods, dyes, or preservatives Pregnant or trying to get pregnant Breast-feeding How should I use this medication? This medication is injected into a vein. It is given by a care team in a hospital or clinic setting. A special MedGuide will be given to you before each treatment. Be sure to read this information carefully each time. Talk to your care team about the use of this medication in children. While this medication may be prescribed for children as young as 6 months for selected conditions, precautions do apply. Overdosage: If you think you have taken too much of this medicine contact a poison control center or emergency room at once. NOTE: This medicine is only for you. Do not share this medicine with others. What if I miss a dose? Keep appointments for follow-up doses. It is important not to miss your dose. Call your care team if you are unable to keep an appointment. What may interact with this medication? Do not take this medication with any of the following: Live vaccines This medication may also interact with the following: Cisplatin This list may not describe all possible interactions. Give your health care provider a list of all the medicines, herbs, non-prescription drugs, or dietary supplements you use. Also tell them if you smoke, drink alcohol, or use illegal drugs. Some items may interact with your medicine. What should I watch for while using this medication? Your condition will be monitored carefully while you are receiving this medication. You may need blood work while taking this medication. This medication can cause serious infusion reactions.  To reduce the risk your care team may give you other medications to take before receiving this one. Be sure to follow the directions from your care team. This medication may increase your risk of getting an infection. Call your care team for advice if you get a fever,  chills, sore throat, or other symptoms of a cold or flu. Do not treat yourself. Try to avoid being around people who are sick. Call your care team if you are around anyone with measles, chickenpox, or if you develop sores or blisters that do not heal properly. Avoid taking medications that contain aspirin, acetaminophen, ibuprofen, naproxen, or ketoprofen unless instructed by your care team. These medications may hide a fever. This medication may cause serious skin reactions. They can happen weeks to months after starting the medication. Contact your care team right away if you notice fevers or flu-like symptoms with a rash. The rash may be red or purple and then turn into blisters or peeling of the skin. You may also notice a red rash with swelling of the face, lips, or lymph nodes in your neck or under your arms. In some patients, this medication may cause a serious brain infection that may cause death. If you have any problems seeing, thinking, speaking, walking, or standing, tell your care team right away. If you cannot reach your care team, urgently seek another source of medical care. Talk to your care team if you may be pregnant. Serious birth defects can occur if you take this medication during pregnancy and for 12 months after the last dose. You will need a negative pregnancy test before starting this medication. Contraception is recommended while taking this medication and for 12 months after the last dose. Your care team can help you find the option that works for you. Do not breastfeed while taking this medication and for at least 6 months after the last dose. What side effects may I notice from receiving this medication? Side effects that you should report to your care team as soon as possible: Allergic reactions or angioedema--skin rash, itching or hives, swelling of the face, eyes, lips, tongue, arms, or legs, trouble swallowing or breathing Bowel blockage--stomach cramping, unable to have a  bowel movement or pass gas, loss of appetite, vomiting Dizziness, loss of balance or coordination, confusion or trouble speaking Heart attack--pain or tightness in the chest, shoulders, arms, or jaw, nausea, shortness of breath, cold or clammy skin, feeling faint or lightheaded Heart rhythm changes--fast or irregular heartbeat, dizziness, feeling faint or lightheaded, chest pain, trouble breathing Infection--fever, chills, cough, sore throat, wounds that don't heal, pain or trouble when passing urine, general feeling of discomfort or being unwell Infusion reactions--chest pain, shortness of breath or trouble breathing, feeling faint or lightheaded Kidney injury--decrease in the amount of urine, swelling of the ankles, hands, or feet Liver injury--right upper belly pain, loss of appetite, nausea, light-colored stool, dark yellow or brown urine, yellowing skin or eyes, unusual weakness or fatigue Redness, blistering, peeling, or loosening of the skin, including inside the mouth Stomach pain that is severe, does not go away, or gets worse Tumor lysis syndrome (TLS)--nausea, vomiting, diarrhea, decrease in the amount of urine, dark urine, unusual weakness or fatigue, confusion, muscle pain or cramps, fast or irregular heartbeat, joint pain Side effects that usually do not require medical attention (report to your care team if they continue or are bothersome): Headache Joint pain Nausea Runny or stuffy nose Unusual weakness or fatigue This list may not describe  all possible side effects. Call your doctor for medical advice about side effects. You may report side effects to FDA at 1-800-FDA-1088. Where should I keep my medication? This medication is given in a hospital or clinic. It will not be stored at home. NOTE: This sheet is a summary. It may not cover all possible information. If you have questions about this medicine, talk to your doctor, pharmacist, or health care provider.  2023  Elsevier/Gold Standard (2022-04-28 00:00:00)       To help prevent nausea and vomiting after your treatment, we encourage you to take your nausea medication as directed.  BELOW ARE SYMPTOMS THAT SHOULD BE REPORTED IMMEDIATELY: *FEVER GREATER THAN 100.4 F (38 C) OR HIGHER *CHILLS OR SWEATING *NAUSEA AND VOMITING THAT IS NOT CONTROLLED WITH YOUR NAUSEA MEDICATION *UNUSUAL SHORTNESS OF BREATH *UNUSUAL BRUISING OR BLEEDING *URINARY PROBLEMS (pain or burning when urinating, or frequent urination) *BOWEL PROBLEMS (unusual diarrhea, constipation, pain near the anus) TENDERNESS IN MOUTH AND THROAT WITH OR WITHOUT PRESENCE OF ULCERS (sore throat, sores in mouth, or a toothache) UNUSUAL RASH, SWELLING OR PAIN  UNUSUAL VAGINAL DISCHARGE OR ITCHING   Items with * indicate a potential emergency and should be followed up as soon as possible or go to the Emergency Department if any problems should occur.  Please show the CHEMOTHERAPY ALERT CARD or IMMUNOTHERAPY ALERT CARD at check-in to the Emergency Department and triage nurse.  Should you have questions after your visit or need to cancel or reschedule your appointment, please contact [facility]  Dept: [phone]  and follow the prompts.  Office hours are 8:00 a.m. to 4:30 p.m. Monday - Friday. Please note that voicemails left after 4:00 p.m. may not be returned until the following business day.  We are closed weekends and major holidays. You have access to a nurse at all times for urgent questions. Please call the main number to the clinic Dept: [phone] and follow the prompts.   For any non-urgent questions, you may also contact your provider using MyChart. We now offer e-Visits for anyone 59 and older to request care online for non-urgent symptoms. For details visit [email or web address].   Also download the MyChart app! Go to the app store, search "MyChart", open the app, select [facility], and log in with  your MyChart username and password.  Masks are optional in the cancer centers. If you would like for your care team to wear a mask while they are taking care of you, please let them know. You may have one support person who is at least 24 years old accompany you for your appointments.

## 2022-11-11 NOTE — Progress Notes (Signed)
Truxima diluted in NS 266m, Lot:  [id number] Exp: Feb 25.  PRaul Del[place] [facility] [facility] BCPS, BCOP 11/11/2022 12:55 PM

## 2022-11-12 ENCOUNTER — Telehealth: Payer: Self-pay

## 2022-11-12 NOTE — Telephone Encounter (Signed)
-----   Message from Willis Modena, RN sent at 11/11/2022  5:53 PM EST ----- Regarding: Dr. Irene Limbo 1st time Rituxan f/u tol well Dr. Irene Limbo 1st time Rituxan tolerated tx well without incident. Pt call back due

## 2022-11-12 NOTE — Telephone Encounter (Signed)
Mr. Benjamin Doyle. Stated that he is doing great. He is eating, drinking, and urinating well. He knows to call the office at [phone] if he has any questions or concerns.

## 2022-11-16 ENCOUNTER — Other Ambulatory Visit: Payer: Self-pay

## 2022-11-17 ENCOUNTER — Other Ambulatory Visit: Payer: Self-pay | Admitting: *Deleted

## 2022-11-17 DIAGNOSIS — C8108 Nodular lymphocyte predominant Hodgkin lymphoma, lymph nodes of multiple sites: Secondary | ICD-10-CM

## 2022-11-18 ENCOUNTER — Inpatient Hospital Stay (HOSPITAL_BASED_OUTPATIENT_CLINIC_OR_DEPARTMENT_OTHER): Payer: BC Managed Care – PPO | Admitting: Hematology

## 2022-11-18 ENCOUNTER — Inpatient Hospital Stay: Payer: BC Managed Care – PPO

## 2022-11-18 ENCOUNTER — Other Ambulatory Visit: Payer: Self-pay

## 2022-11-18 ENCOUNTER — Inpatient Hospital Stay (HOSPITAL_BASED_OUTPATIENT_CLINIC_OR_DEPARTMENT_OTHER): Payer: BC Managed Care – PPO

## 2022-11-18 VITALS — BP 124/78 | HR 78 | Temp 98.5°F | Resp 19

## 2022-11-18 VITALS — BP 114/63 | HR 92 | Temp 98.0°F | Resp 17 | Ht 75.0 in | Wt 197.4 lb

## 2022-11-18 DIAGNOSIS — C8108 Nodular lymphocyte predominant Hodgkin lymphoma, lymph nodes of multiple sites: Secondary | ICD-10-CM

## 2022-11-18 DIAGNOSIS — Z5112 Encounter for antineoplastic immunotherapy: Secondary | ICD-10-CM | POA: Diagnosis not present

## 2022-11-18 DIAGNOSIS — Z7189 Other specified counseling: Secondary | ICD-10-CM | POA: Diagnosis not present

## 2022-11-18 LAB — CBC WITH DIFFERENTIAL (CANCER CENTER ONLY)
Abs Immature Granulocytes: 0.01 10*3/uL (ref 0.00–0.07)
Basophils Absolute: 0 10*3/uL (ref 0.0–0.1)
Basophils Relative: 0 %
Eosinophils Absolute: 0.4 10*3/uL (ref 0.0–0.5)
Eosinophils Relative: 7 %
HCT: 44.6 % (ref 39.0–52.0)
Hemoglobin: 15.4 g/dL (ref 13.0–17.0)
Immature Granulocytes: 0 %
Lymphocytes Relative: 42 %
Lymphs Abs: 2 10*3/uL (ref 0.7–4.0)
MCH: 29.1 pg (ref 26.0–34.0)
MCHC: 34.5 g/dL (ref 30.0–36.0)
MCV: 84.2 fL (ref 80.0–100.0)
Monocytes Absolute: 0.4 10*3/uL (ref 0.1–1.0)
Monocytes Relative: 9 %
Neutro Abs: 2 10*3/uL (ref 1.7–7.7)
Neutrophils Relative %: 42 %
Platelet Count: 253 10*3/uL (ref 150–400)
RBC: 5.3 MIL/uL (ref 4.22–5.81)
RDW: 13.1 % (ref 11.5–15.5)
WBC Count: 4.8 10*3/uL (ref 4.0–10.5)
nRBC: 0 % (ref 0.0–0.2)

## 2022-11-18 LAB — CMP (CANCER CENTER ONLY)
ALT: 26 U/L (ref 0–44)
AST: 24 U/L (ref 15–41)
Albumin: 4 g/dL (ref 3.5–5.0)
Alkaline Phosphatase: 61 U/L (ref 38–126)
Anion gap: 5 (ref 5–15)
BUN: 15 mg/dL (ref 6–20)
CO2: 25 mmol/L (ref 22–32)
Calcium: 9.2 mg/dL (ref 8.9–10.3)
Chloride: 106 mmol/L (ref 98–111)
Creatinine: 0.81 mg/dL (ref 0.61–1.24)
GFR, Estimated: >60 mL/min (ref >60)
Glucose, Bld: 91 mg/dL (ref 70–99)
Potassium: 4.2 mmol/L (ref 3.5–5.1)
Sodium: 136 mmol/L (ref 135–145)
Total Bilirubin: 1 mg/dL (ref 0.3–1.2)
Total Protein: 7.2 g/dL (ref 6.5–8.1)

## 2022-11-18 LAB — LACTATE DEHYDROGENASE: LDH: 111 U/L (ref 98–192)

## 2022-11-18 MED ORDER — SODIUM CHLORIDE 0.9 % IV SOLN: Freq: Once | INTRAVENOUS | Status: AC

## 2022-11-18 MED ORDER — METHYLPREDNISOLONE SODIUM SUCC 125 MG IJ SOLR
125.0000 mg | Freq: Once | INTRAMUSCULAR | Status: AC
  Administered 2022-11-18: 125 mg via INTRAVENOUS
  Filled 2022-11-18: qty 2

## 2022-11-18 MED ORDER — DIPHENHYDRAMINE HCL 25 MG PO CAPS
50.0000 mg | ORAL_CAPSULE | Freq: Once | ORAL | Status: AC
  Administered 2022-11-18: 50 mg via ORAL
  Filled 2022-11-18: qty 2

## 2022-11-18 MED ORDER — FAMOTIDINE IN NACL 20-0.9 MG/50ML-% IV SOLN
20.0000 mg | Freq: Once | INTRAVENOUS | Status: AC
  Administered 2022-11-18: 20 mg via INTRAVENOUS
  Filled 2022-11-18: qty 50

## 2022-11-18 MED ORDER — SODIUM CHLORIDE 0.9 % IV SOLN
375.0000 mg/m2 | Freq: Once | INTRAVENOUS | Status: AC
  Administered 2022-11-18: 800 mg via INTRAVENOUS
  Filled 2022-11-18: qty 50

## 2022-11-18 MED ORDER — SODIUM CHLORIDE 0.9 % IV SOLN: 375.0000 mg/m2 | Freq: Once | INTRAVENOUS | Status: DC

## 2022-11-18 MED ORDER — ACETAMINOPHEN 325 MG PO TABS
650.0000 mg | ORAL_TABLET | Freq: Once | ORAL | Status: AC
  Administered 2022-11-18: 650 mg via ORAL
  Filled 2022-11-18: qty 2

## 2022-11-18 NOTE — Progress Notes (Signed)
Rapid Infusion Rituximab Pharmacist Evaluation  Benjamin Doyle is a 26 y.o. male being treated with rituximab for Hodgkin's lymphoma. This patient may be considered for RIR.   A pharmacist has verified the patient tolerated rituximab infusions per the [facility] standard infusion protocol without grade 3-4 infusion reactions. The treatment plan will be updated to reflect RIR if the patient qualifies per the checklist below:   Age > 83 years old Yes   Clinically significant cardiovascular disease No   Circulating lymphocyte count < 5000/uL prior to cycle two Yes  Lab Results  Component Value Date   LYMPHSABS 2.0 11/18/2022    Prior documented grade 3-4 infusion reaction to rituximab No   Prior documented grade 1-2 infusion reaction to rituximab (If YES, Pharmacist will confirm with Physician if patient is still a candidate for RIR) No   Previous rituximab infusion within the past 6 months Yes   Treatment Plan updated orders to reflect Benjamin Doyle does meet the criteria for Rapid Infusion Rituximab. This patient is going to be switched to rapid infusion rituximab.   Benjamin Doyle, Pharm.D., CPP 11/18/2022'@11'$ :44 AM

## 2022-11-18 NOTE — Progress Notes (Signed)
HEMATOLOGY/ONCOLOGY CLINIC NOTE  Date of Service: 11/18/22  Patient Care Team: Benjamin Solders, MD as PCP - General  CHIEF COMPLAINTS:  Nodular lymphocyte predominant Hodgkin's lymphoma  HISTORY OF PRESENTING ILLNESS:   Benjamin Doyle is a wonderful 26 y.o. male who has been referred to [place] by Benjamin Doyle for evaluation and management of newly diagnosed nodular lymphocyte predominant Hodgkin's lymphoma.  Patient has no significant chronic medical issues and was seen by Benjamin. Melida Doyle for initial consultation on 06/04/2022 with increasing size of left upper neck lymph nodes. He notes that he has had enlarged lymph nodes in his neck on and off all the time in his neck and groin as well as under the armpits for the last 5+ years but recently the left neck lymph nodes were getting larger since about April 2023. Patient notes that he has been aggressively working out and did lose some weight on account of dietary changes and extensive physical exercise regimen and feels that this could have also made his lymph nodes feel more prominent.  Of note he did have a CT of the maxillofacial region with Benjamin. Leonard Schwartz MD in October 2017 which showed mild bilateral posterior cervical lymphadenopathy as well as significantly enlarged left submandibular adenopathy.  The largest lymph node measured 17 x 11 mm in size in the left submandibular region. this may be inflammatory in etiology but neoplasm cannot be ruled out.  After seeing Benjamin. Melida Doyle the patient had a CT of the neck on 06/11/2022 which showed a cluster of homogeneously enlarged lymph nodes in the left neck with the largest being in the submandibular space at about 3.2 cm.  Lymphoma or granulomatous disease are the leading considerations.  Patient eventually had a surgical biopsy of his left submandibular lymph node on 09/21/2022 with Benjamin. Melida Doyle which showed nodular lymphocyte predominant Hodgkin's lymphoma.  Patient notes  weight loss of about 10-15 pounds over the last couple of years though this is in the context of dietary changes and heavy physical exercise.  No significant new fatigue.  No unexplained fevers or chills.  No drenching night sweats. Acute chest pain or shortness of breath. No abdominal pain or distention. No focal bone pains or generalized bone pains. Skin rashes or pruritus. Denies any concerning unsafe sexual exposure or body fluid exposure. Denies use of IV drugs.   INTERVAL HISTORY:  Benjamin Doyle is a 26 y.o. male who is here today for continued evaluation and management of nodular lymphocyte predominant Hodgkin's lymphoma. Patient was last seen by me on 10/26/2022 and was doing well without any new medical concerns. He is here to start cycle 2 day 1 of Rituximab.  Patient reports he is doing well overall since our last visit. He denies any toxicities from his treatment. He denies loss of appetite, fever, chills, abnormal bowl moments, back pain, abdominal pain, and leg swelling. He notes his lymph nodes are slowly decreasing in size.   He has not received the influenza vaccine and COVID-19 vaccines.   MEDICAL HISTORY:  Past Medical History:  Diagnosis Date   Asthma    Heart murmur     SURGICAL HISTORY: Past Surgical History:  Procedure Laterality Date   TYMPANOSTOMY TUBE PLACEMENT      SOCIAL HISTORY: Social History   Socioeconomic History   Marital status: Single    Spouse name: Not on file   Number of children: Not on file   Years of education: Not  on file   Highest education level: Not on file  Occupational History   Not on file  Tobacco Use   Smoking status: Never   Smokeless tobacco: Never  Substance and Sexual Activity   Alcohol use: No   Drug use: No   Sexual activity: Not on file  Other Topics Concern   Not on file  Social History Narrative   Not on file   Social Determinants of Health   Financial Resource Strain: Not on file  Food  Insecurity: Not on file  Transportation Needs: Not on file  Physical Activity: Not on file  Stress: Not on file  Social Connections: Not on file  Intimate Partner Violence: Not on file  Notes no cigarette smoking or vaping. Minimal social alcohol use. No IV drug use.  FAMILY HISTORY: Family History  Problem Relation Age of Onset   Hypertension Mother    Hyperlipidemia Father    Hypertension Father    Hypertension Maternal Grandmother    Diabetes Maternal Grandfather    Birth defects Paternal Grandmother    Hyperlipidemia Paternal Grandmother    Hypertension Paternal Grandmother    Hypertension Paternal Grandfather    Alcohol abuse Neg Hx    Arthritis Neg Hx    Asthma Neg Hx    Cancer Neg Hx    COPD Neg Hx    Depression Neg Hx    Drug abuse Neg Hx    Early death Neg Hx    Hearing loss Neg Hx    Heart disease Neg Hx    Kidney disease Neg Hx    Learning disabilities Neg Hx    Mental illness Neg Hx    Mental retardation Neg Hx    Miscarriages / Stillbirths Neg Hx    Stroke Neg Hx    Vision loss Neg Hx    Varicose Veins Neg Hx     ALLERGIES:  has No Known Allergies.  MEDICATIONS:  Current Outpatient Medications  Medication Sig Dispense Refill   albuterol (PROVENTIL HFA;VENTOLIN HFA) 108 (90 BASE) MCG/ACT inhaler Inhale 2 puffs into the lungs every 6 (six) hours as needed for wheezing or shortness of breath. 2 Inhaler 3   cetirizine (ZYRTEC) 10 MG tablet Take 1 tablet (10 mg total) by mouth daily. 30 tablet 2   ergocalciferol (VITAMIN D2) 1.25 MG (50000 UT) capsule Take 1 capsule (50,000 Units total) by mouth once a week. 12 capsule 1   ondansetron (ZOFRAN) 8 MG tablet Take 1 tablet (8 mg total) by mouth every 8 (eight) hours as needed for nausea or vomiting. 20 tablet 0   No current facility-administered medications for this visit.    REVIEW OF SYSTEMS:    .10 Point review of Systems was done is negative except as noted above.  PHYSICAL EXAMINATION: ECOG  PERFORMANCE STATUS: 1 - Symptomatic but completely ambulatory  . Vitals:   11/18/22 1057  BP: 114/63  Pulse: 92  Resp: 17  Temp: 98 F (36.7 C)  SpO2: 98%     Filed Weights   11/18/22 1057  Weight: 197 lb 6.4 oz (89.5 kg)   .Body mass index is 24.67 kg/m.  GENERAL:alert, in no acute distress and comfortable SKIN: no acute rashes, no significant lesions EYES: conjunctiva are pink and non-injected, sclera anicteric OROPHARYNX: MMM, no exudates, no oropharyngeal erythema or ulceration NECK: supple, no JVD, left neck well healed surgical scar from biopsy. LYMPH:  lymphadenopathy bilateral posterior neck and left submandibular regions, right epitrochlear, bilateral inguinal regions. LUNGS: clear  to auscultation b/l with normal respiratory effort HEART: regular rate & rhythm ABDOMEN:  normoactive bowel sounds , non tender, not distended.  No palpable hepatosplenomegaly Extremity: no pedal edema PSYCH: alert & oriented x 3 with fluent speech NEURO: no focal motor/sensory deficits  LABORATORY DATA:  I have reviewed the data as listed     Latest Ref Rng & Units 11/18/2022   10:11 AM 11/11/2022   11:24 AM 10/02/2022    1:05 PM  CBC  WBC 4.0 - 10.5 K/uL 4.8  5.0  5.8   Hemoglobin 13.0 - 17.0 g/dL 15.4  14.6  15.0   Hematocrit 39.0 - 52.0 % 44.6  42.9  44.1   Platelets 150 - 400 K/uL 253  237  312       Latest Ref Rng & Units 11/18/2022   10:11 AM 11/11/2022   11:24 AM 10/02/2022    1:05 PM  CMP  Glucose 70 - 99 mg/dL 91  77  93   BUN 6 - 20 mg/dL '15  17  18   '$ Creatinine 0.61 - 1.24 mg/dL 0.81  1.04  0.88   Sodium 135 - 145 mmol/L 136  139  141   Potassium 3.5 - 5.1 mmol/L 4.2  4.3  3.8   Chloride 98 - 111 mmol/L 106  105  104   CO2 22 - 32 mmol/L '25  28  31   '$ Calcium 8.9 - 10.3 mg/dL 9.2  9.8  9.5   Total Protein 6.5 - 8.1 g/dL 7.2  7.5  8.0   Total Bilirubin 0.3 - 1.2 mg/dL 1.0  0.7  0.6   Alkaline Phos 38 - 126 U/L 61  67  76   AST 15 - 41 U/L '24  24  18   '$ ALT 0  - 44 U/L '26  18  21    '$ . Lab Results  Component Value Date   LDH 111 11/18/2022        Latest Ref Rng & Units 10/02/2022    1:05 PM  Hepatitis  Hep B Surface Ag NON REACTIVE NON REACTIVE   Hep C Ab NON REACTIVE NON REACTIVE    HIV antibody 10/02/2022: Non-reactive RPR 10/02/2022: Non-reactive  Last vitamin D Lab Results  Component Value Date   VD25OH 13.45 (L) 10/02/2022    Lab Results  Component Value Date   ESRSEDRATE 3 10/02/2022    LDH 10/02/2022: 146   RADIOGRAPHIC STUDIES: I have personally reviewed the radiological images as listed and agreed with the findings in the report.  NM PET Image Initial (PI) Skull Base To Thigh Result Date: 10/26/2022 CLINICAL DATA:  Initial treatment strategy for Hodgkin's lymphoma. EXAM: NUCLEAR MEDICINE PET SKULL BASE TO THIGH TECHNIQUE: 9.82 mCi F-18 FDG was injected intravenously. Full-ring PET imaging was performed from the skull base to thigh after the radiotracer. CT data was obtained and used for attenuation correction and anatomic localization. Fasting blood glucose: 88 mg/dl COMPARISON:  CT neck June 11, 2022 FINDINGS: Mediastinal blood pool activity: SUV max 1.6 Liver activity: SUV max 2.7 NECK: Left-greater-than-right hypermetabolic cervical adenopathy. For reference: -left level 2b lymph node measures 19 x 10 mm on image 74/3 with a max SUV of 5.6. -right level 2b lymph node measures 22 x 14 mm on image 63/3 with a max SUV of 6.0 Hypermetabolic hyperplasia of the tonsils with a max SUV of 17.4. Incidental CT findings: None. CHEST: No hypermetabolic thoracic adenopathy. No hypermetabolic pulmonary nodules or masses. Physiologic metabolic activity  in the thyroid remnant with a max SUV of 3.01. Incidental CT findings: None. ABDOMEN/PELVIS: Hypermetabolic bilateral pelvic sidewall and iliac side chain and inguinal lymph nodes. For reference: -Hypermetabolic right pelvic sidewall lymph node measures 2.5 x 0.9 cm on image 290/3 with a max SUV  of 29.4. -Hypermetabolic left pelvic sidewall lymph node measures 2.2 x 1.1 cm on image 289/3 with a max SUV of 6.8. No abnormal hypermetabolic activity in the liver or spleen. Hypermetabolic descending and sigmoid colonic activity is favored physiologic. Incidental CT findings: No splenomegaly. SKELETON: No focal hypermetabolic activity to suggest skeletal metastasis. Incidental CT findings: None. IMPRESSION: 1. Hypermetabolic cervical, pelvic and inguinal adenopathy compatible with patient's known diagnosis of Hodgkin's lymphoma. 2. Hypermetabolic hyperplasia of the tonsils, likely reflects lymphomatous disease involvement. 3. No splenomegaly. Electronically Signed   By: Dahlia Bailiff M.D.   On: 10/26/2022 08:55    CT neck 06/11/2022 IMPRESSION:  Cluster of homogeneously enlarged lymph nodes in the left neck, largest being in the submandibular space at up to 3.2 cm. Lymphoma or granulomatous disease are the leading considerations.   CT Maxillofacial 19/28/2017 IMPRESSION: Mild bilateral maxillary sinusitis. Mild bilateral posterior cervical adenopathy is noted, as well as significantly enlarged left submandibular adenopathy. This may simply be inflammatory in etiology, but neoplasm cannot be excluded. Clinical correlation is recommended.  ASSESSMENT & PLAN:   26 y.o. 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 -This would be in keeping with the slow progression of nodular lymphocyte predominant Hodgkin's lymphoma. -PET CT 10/22/2022 revealed Hypermetabolic cervical, pelvic and inguinal adenopathy as well as tonsillar enlargement.   PLAN: -Discussed lab results from today, 11/18/2022, with the patient. Lab shows WBC of 4.8 K,  hemoglobin of 15.4 K, and platelets of 253. Cmp stable. -Proceed with Rituximab with the same dosage and same supportive medications. Okay to switch to rapid Rituxan protocol. -Will plan on PET scan about 2-3 months after his 4 weekly doses of Rituxan completed..   FOLLOW UP: As per integrated scheduling.

## 2022-11-18 NOTE — Progress Notes (Signed)
Patient seen by MD today  Vitals are within treatment parameters.  Labs reviewed: and are within treatment parameters.  Per physician team, patient is ready for treatment and there are NO modifications to the treatment plan.  

## 2022-11-24 ENCOUNTER — Encounter: Payer: Self-pay | Admitting: Hematology

## 2022-11-24 ENCOUNTER — Other Ambulatory Visit: Payer: Self-pay

## 2022-11-24 DIAGNOSIS — C8108 Nodular lymphocyte predominant Hodgkin lymphoma, lymph nodes of multiple sites: Secondary | ICD-10-CM

## 2022-11-25 ENCOUNTER — Inpatient Hospital Stay: Payer: BC Managed Care – PPO

## 2022-11-25 ENCOUNTER — Other Ambulatory Visit: Payer: Self-pay

## 2022-11-25 VITALS — BP 136/67 | HR 63 | Temp 97.9°F | Resp 18 | Wt 201.8 lb

## 2022-11-25 DIAGNOSIS — C8108 Nodular lymphocyte predominant Hodgkin lymphoma, lymph nodes of multiple sites: Secondary | ICD-10-CM

## 2022-11-25 DIAGNOSIS — Z7189 Other specified counseling: Secondary | ICD-10-CM

## 2022-11-25 DIAGNOSIS — Z5112 Encounter for antineoplastic immunotherapy: Secondary | ICD-10-CM | POA: Diagnosis not present

## 2022-11-25 LAB — CBC WITH DIFFERENTIAL (CANCER CENTER ONLY)
Abs Immature Granulocytes: 0 10*3/uL (ref 0.00–0.07)
Basophils Absolute: 0 10*3/uL (ref 0.0–0.1)
Basophils Relative: 0 %
Eosinophils Absolute: 0.3 10*3/uL (ref 0.0–0.5)
Eosinophils Relative: 5 %
HCT: 44.9 % (ref 39.0–52.0)
Hemoglobin: 15.3 g/dL (ref 13.0–17.0)
Immature Granulocytes: 0 %
Lymphocytes Relative: 35 %
Lymphs Abs: 1.8 10*3/uL (ref 0.7–4.0)
MCH: 28.9 pg (ref 26.0–34.0)
MCHC: 34.1 g/dL (ref 30.0–36.0)
MCV: 84.7 fL (ref 80.0–100.0)
Monocytes Absolute: 0.3 10*3/uL (ref 0.1–1.0)
Monocytes Relative: 7 %
Neutro Abs: 2.7 10*3/uL (ref 1.7–7.7)
Neutrophils Relative %: 53 %
Platelet Count: 258 10*3/uL (ref 150–400)
RBC: 5.3 MIL/uL (ref 4.22–5.81)
RDW: 13.1 % (ref 11.5–15.5)
WBC Count: 5 10*3/uL (ref 4.0–10.5)
nRBC: 0 % (ref 0.0–0.2)

## 2022-11-25 LAB — CMP (CANCER CENTER ONLY)
ALT: 20 U/L (ref 0–44)
AST: 21 U/L (ref 15–41)
Albumin: 4.6 g/dL (ref 3.5–5.0)
Alkaline Phosphatase: 66 U/L (ref 38–126)
Anion gap: 5 (ref 5–15)
BUN: 15 mg/dL (ref 6–20)
CO2: 27 mmol/L (ref 22–32)
Calcium: 9.9 mg/dL (ref 8.9–10.3)
Chloride: 107 mmol/L (ref 98–111)
Creatinine: 0.97 mg/dL (ref 0.61–1.24)
GFR, Estimated: >60 mL/min (ref >60)
Glucose, Bld: 83 mg/dL (ref 70–99)
Potassium: 4 mmol/L (ref 3.5–5.1)
Sodium: 139 mmol/L (ref 135–145)
Total Bilirubin: 0.5 mg/dL (ref 0.3–1.2)
Total Protein: 7.5 g/dL (ref 6.5–8.1)

## 2022-11-25 LAB — LACTATE DEHYDROGENASE: LDH: 124 U/L (ref 98–192)

## 2022-11-25 MED ORDER — ACETAMINOPHEN 325 MG PO TABS
650.0000 mg | ORAL_TABLET | Freq: Once | ORAL | Status: AC
  Administered 2022-11-25: 650 mg via ORAL
  Filled 2022-11-25: qty 2

## 2022-11-25 MED ORDER — DIPHENHYDRAMINE HCL 25 MG PO CAPS
50.0000 mg | ORAL_CAPSULE | Freq: Once | ORAL | Status: AC
  Administered 2022-11-25: 50 mg via ORAL
  Filled 2022-11-25: qty 2

## 2022-11-25 MED ORDER — SODIUM CHLORIDE 0.9 % IV SOLN: Freq: Once | INTRAVENOUS | Status: AC

## 2022-11-25 MED ORDER — FAMOTIDINE IN NACL 20-0.9 MG/50ML-% IV SOLN
20.0000 mg | Freq: Once | INTRAVENOUS | Status: AC
  Administered 2022-11-25: 20 mg via INTRAVENOUS
  Filled 2022-11-25: qty 50

## 2022-11-25 MED ORDER — SODIUM CHLORIDE 0.9 % IV SOLN
375.0000 mg/m2 | Freq: Once | INTRAVENOUS | Status: AC
  Administered 2022-11-25: 800 mg via INTRAVENOUS
  Filled 2022-11-25: qty 50

## 2022-11-25 MED ORDER — METHYLPREDNISOLONE SODIUM SUCC 125 MG IJ SOLR
125.0000 mg | Freq: Once | INTRAMUSCULAR | Status: AC
  Administered 2022-11-25: 125 mg via INTRAVENOUS
  Filled 2022-11-25: qty 2

## 2022-12-01 ENCOUNTER — Other Ambulatory Visit: Payer: Self-pay | Admitting: *Deleted

## 2022-12-01 DIAGNOSIS — C8108 Nodular lymphocyte predominant Hodgkin lymphoma, lymph nodes of multiple sites: Secondary | ICD-10-CM

## 2022-12-02 ENCOUNTER — Inpatient Hospital Stay (HOSPITAL_BASED_OUTPATIENT_CLINIC_OR_DEPARTMENT_OTHER): Payer: BC Managed Care – PPO | Admitting: Hematology

## 2022-12-02 ENCOUNTER — Inpatient Hospital Stay: Payer: BC Managed Care – PPO | Attending: Hematology

## 2022-12-02 ENCOUNTER — Other Ambulatory Visit: Payer: Self-pay

## 2022-12-02 ENCOUNTER — Inpatient Hospital Stay: Payer: BC Managed Care – PPO

## 2022-12-02 VITALS — BP 132/74 | HR 73 | Temp 97.8°F | Resp 18 | Wt 197.8 lb

## 2022-12-02 DIAGNOSIS — C8101 Nodular lymphocyte predominant Hodgkin lymphoma, lymph nodes of head, face, and neck: Secondary | ICD-10-CM | POA: Insufficient documentation

## 2022-12-02 DIAGNOSIS — R634 Abnormal weight loss: Secondary | ICD-10-CM | POA: Insufficient documentation

## 2022-12-02 DIAGNOSIS — Z7189 Other specified counseling: Secondary | ICD-10-CM | POA: Diagnosis not present

## 2022-12-02 DIAGNOSIS — C8108 Nodular lymphocyte predominant Hodgkin lymphoma, lymph nodes of multiple sites: Secondary | ICD-10-CM

## 2022-12-02 DIAGNOSIS — Z5112 Encounter for antineoplastic immunotherapy: Secondary | ICD-10-CM | POA: Diagnosis present

## 2022-12-02 DIAGNOSIS — Z23 Encounter for immunization: Secondary | ICD-10-CM | POA: Insufficient documentation

## 2022-12-02 LAB — CMP (CANCER CENTER ONLY)
ALT: 19 U/L (ref 0–44)
AST: 24 U/L (ref 15–41)
Albumin: 4.4 g/dL (ref 3.5–5.0)
Alkaline Phosphatase: 65 U/L (ref 38–126)
Anion gap: 6 (ref 5–15)
BUN: 14 mg/dL (ref 6–20)
CO2: 26 mmol/L (ref 22–32)
Calcium: 9.9 mg/dL (ref 8.9–10.3)
Chloride: 106 mmol/L (ref 98–111)
Creatinine: 0.84 mg/dL (ref 0.61–1.24)
GFR, Estimated: >60 mL/min (ref >60)
Glucose, Bld: 91 mg/dL (ref 70–99)
Potassium: 4 mmol/L (ref 3.5–5.1)
Sodium: 138 mmol/L (ref 135–145)
Total Bilirubin: 1.2 mg/dL (ref 0.3–1.2)
Total Protein: 7 g/dL (ref 6.5–8.1)

## 2022-12-02 LAB — CBC WITH DIFFERENTIAL (CANCER CENTER ONLY)
Abs Immature Granulocytes: 0 10*3/uL (ref 0.00–0.07)
Basophils Absolute: 0 10*3/uL (ref 0.0–0.1)
Basophils Relative: 0 %
Eosinophils Absolute: 0.1 10*3/uL (ref 0.0–0.5)
Eosinophils Relative: 4 %
HCT: 43.8 % (ref 39.0–52.0)
Hemoglobin: 14.8 g/dL (ref 13.0–17.0)
Immature Granulocytes: 0 %
Lymphocytes Relative: 41 %
Lymphs Abs: 1.7 10*3/uL (ref 0.7–4.0)
MCH: 28.8 pg (ref 26.0–34.0)
MCHC: 33.8 g/dL (ref 30.0–36.0)
MCV: 85.4 fL (ref 80.0–100.0)
Monocytes Absolute: 0.4 10*3/uL (ref 0.1–1.0)
Monocytes Relative: 9 %
Neutro Abs: 1.8 10*3/uL (ref 1.7–7.7)
Neutrophils Relative %: 46 %
Platelet Count: 254 10*3/uL (ref 150–400)
RBC: 5.13 MIL/uL (ref 4.22–5.81)
RDW: 13.1 % (ref 11.5–15.5)
WBC Count: 4 10*3/uL (ref 4.0–10.5)
nRBC: 0 % (ref 0.0–0.2)

## 2022-12-02 LAB — LACTATE DEHYDROGENASE: LDH: 127 U/L (ref 98–192)

## 2022-12-02 MED ORDER — METHYLPREDNISOLONE SODIUM SUCC 125 MG IJ SOLR
125.0000 mg | Freq: Once | INTRAMUSCULAR | Status: AC
  Administered 2022-12-02: 125 mg via INTRAVENOUS
  Filled 2022-12-02: qty 2

## 2022-12-02 MED ORDER — HEPARIN SOD (PORK) LOCK FLUSH 100 UNIT/ML IV SOLN: 500.0000 [IU] | Freq: Once | INTRAVENOUS | Status: DC | PRN

## 2022-12-02 MED ORDER — DIPHENHYDRAMINE HCL 25 MG PO CAPS
50.0000 mg | ORAL_CAPSULE | Freq: Once | ORAL | Status: AC
  Administered 2022-12-02: 50 mg via ORAL
  Filled 2022-12-02: qty 2

## 2022-12-02 MED ORDER — SODIUM CHLORIDE 0.9 % IV SOLN
375.0000 mg/m2 | Freq: Once | INTRAVENOUS | Status: AC
  Administered 2022-12-02: 800 mg via INTRAVENOUS
  Filled 2022-12-02: qty 50

## 2022-12-02 MED ORDER — ACETAMINOPHEN 325 MG PO TABS
650.0000 mg | ORAL_TABLET | Freq: Once | ORAL | Status: AC
  Administered 2022-12-02: 650 mg via ORAL
  Filled 2022-12-02: qty 2

## 2022-12-02 MED ORDER — SODIUM CHLORIDE 0.9 % IV SOLN: Freq: Once | INTRAVENOUS | Status: AC

## 2022-12-02 MED ORDER — FAMOTIDINE IN NACL 20-0.9 MG/50ML-% IV SOLN
20.0000 mg | Freq: Once | INTRAVENOUS | Status: AC
  Administered 2022-12-02: 20 mg via INTRAVENOUS
  Filled 2022-12-02: qty 50

## 2022-12-02 MED ORDER — INFLUENZA VAC SPLIT QUAD 0.5 ML IM SUSY
0.5000 mL | PREFILLED_SYRINGE | Freq: Once | INTRAMUSCULAR | Status: AC
  Administered 2022-12-02: 0.5 mL via INTRAMUSCULAR
  Filled 2022-12-02: qty 0.5

## 2022-12-02 MED ORDER — SODIUM CHLORIDE 0.9% FLUSH: 10.0000 mL | INTRAVENOUS | Status: DC | PRN

## 2022-12-02 NOTE — Patient Instructions (Signed)
[facility]  Discharge Instructions: Thank you for choosing [facility] to provide your oncology and hematology care.   If you have a lab appointment with the Cancer Center, please go directly to the Cancer Center and check in at the registration area.   Wear comfortable clothing and clothing appropriate for easy access to any Portacath or PICC line.   We strive to give you quality time with your provider. You may need to reschedule your appointment if you arrive late (15 or more minutes).  Arriving late affects you and other patients whose appointments are after yours.  Also, if you miss three or more appointments without notifying the office, you may be dismissed from the clinic at the provider's discretion.      For prescription refill requests, have your pharmacy contact our office and allow 72 hours for refills to be completed.    Today you received the following chemotherapy and/or immunotherapy agents : Rituximab      To help prevent nausea and vomiting after your treatment, we encourage you to take your nausea medication as directed.  BELOW ARE SYMPTOMS THAT SHOULD BE REPORTED IMMEDIATELY: *FEVER GREATER THAN 100.4 F (38 C) OR HIGHER *CHILLS OR SWEATING *NAUSEA AND VOMITING THAT IS NOT CONTROLLED WITH YOUR NAUSEA MEDICATION *UNUSUAL SHORTNESS OF BREATH *UNUSUAL BRUISING OR BLEEDING *URINARY PROBLEMS (pain or burning when urinating, or frequent urination) *BOWEL PROBLEMS (unusual diarrhea, constipation, pain near the anus) TENDERNESS IN MOUTH AND THROAT WITH OR WITHOUT PRESENCE OF ULCERS (sore throat, sores in mouth, or a toothache) UNUSUAL RASH, SWELLING OR PAIN  UNUSUAL VAGINAL DISCHARGE OR ITCHING   Items with * indicate a potential emergency and should be followed up as soon as possible or go to the Emergency Department if any problems should occur.  Please show the CHEMOTHERAPY ALERT CARD or IMMUNOTHERAPY ALERT CARD at check-in to  the Emergency Department and triage nurse.  Should you have questions after your visit or need to cancel or reschedule your appointment, please contact [facility]  Dept: [phone]  and follow the prompts.  Office hours are 8:00 a.m. to 4:30 p.m. Monday - Friday. Please note that voicemails left after 4:00 p.m. may not be returned until the following business day.  We are closed weekends and major holidays. You have access to a nurse at all times for urgent questions. Please call the main number to the clinic Dept: [phone] and follow the prompts.   For any non-urgent questions, you may also contact your provider using MyChart. We now offer e-Visits for anyone 18 and older to request care online for non-urgent symptoms. For details visit [email or web address].   Also download the MyChart app! Go to the app store, search "MyChart", open the app, select [facility], and log in with your MyChart username and password.  Masks are optional in the cancer centers. If you would like for your care team to wear a mask while they are taking care of you, please let them know. You may have one support person who is at least 24 years old accompany you for your appointments. 

## 2022-12-02 NOTE — Progress Notes (Signed)
HEMATOLOGY/ONCOLOGY CLINIC NOTE  Date of Service: 12/02/22  Patient Care Team: Marcha Solders, MD as PCP - General  CHIEF COMPLAINTS:  Nodular lymphocyte predominant Hodgkin's lymphoma  HISTORY OF PRESENTING ILLNESS:   Benjamin Doyle is a wonderful 26 y.o. male who has been referred to [place] by Dr Melida Quitter for evaluation and management of newly diagnosed nodular lymphocyte predominant Hodgkin's lymphoma.  Patient has no significant chronic medical issues and was seen by Dr. Melida Quitter for initial consultation on 06/04/2022 with increasing size of left upper neck lymph nodes. He notes that he has had enlarged lymph nodes in his neck on and off all the time in his neck and groin as well as under the armpits for the last 5+ years but recently the left neck lymph nodes were getting larger since about April 2023. Patient notes that he has been aggressively working out and did lose some weight on account of dietary changes and extensive physical exercise regimen and feels that this could have also made his lymph nodes feel more prominent.  Of note he did have a CT of the maxillofacial region with Dr. Leonard Schwartz MD in October 2017 which showed mild bilateral posterior cervical lymphadenopathy as well as significantly enlarged left submandibular adenopathy.  The largest lymph node measured 17 x 11 mm in size in the left submandibular region. this may be inflammatory in etiology but neoplasm cannot be ruled out.  After seeing Dr. Melida Quitter the patient had a CT of the neck on 06/11/2022 which showed a cluster of homogeneously enlarged lymph nodes in the left neck with the largest being in the submandibular space at about 3.2 cm.  Lymphoma or granulomatous disease are the leading considerations.  Patient eventually had a surgical biopsy of his left submandibular lymph node on 09/21/2022 with Dr. Melida Quitter which showed nodular lymphocyte predominant Hodgkin's lymphoma.  Patient notes  weight loss of about 10-15 pounds over the last couple of years though this is in the context of dietary changes and heavy physical exercise.  No significant new fatigue.  No unexplained fevers or chills.  No drenching night sweats. Acute chest pain or shortness of breath. No abdominal pain or distention. No focal bone pains or generalized bone pains. Skin rashes or pruritus. Denies any concerning unsafe sexual exposure or body fluid exposure. Denies use of IV drugs.   INTERVAL HISTORY:  Benjamin Doyle is a 26 y.o. male who is here today for continued evaluation and management of nodular lymphocyte predominant Hodgkin's lymphoma. He is here to start cycle 4 day 1 of weekly Rituximab.  At his last visit with me on 11/18/2022, he was doing well overall without any new medical concerns.  Patient reports he has been doing well without any new medical concerns. Patient denies any toxicities with his treatment.  He denies fever, chills, new infection issues, abdominal pain, back pain, or leg swelling.  Patient has not received his influenza vaccine.   MEDICAL HISTORY:  Past Medical History:  Diagnosis Date   Asthma    Heart murmur     SURGICAL HISTORY: Past Surgical History:  Procedure Laterality Date   TYMPANOSTOMY TUBE PLACEMENT      SOCIAL HISTORY: Social History   Socioeconomic History   Marital status: Single    Spouse name: Not on file   Number of children: Not on file   Years of education: Not on file   Highest education level: Not on file  Occupational  History   Not on file  Tobacco Use   Smoking status: Never   Smokeless tobacco: Never  Substance and Sexual Activity   Alcohol use: No   Drug use: No   Sexual activity: Not on file  Other Topics Concern   Not on file  Social History Narrative   Not on file   Social Determinants of Health   Financial Resource Strain: Not on file  Food Insecurity: Not on file  Transportation Needs: Not on file   Physical Activity: Not on file  Stress: Not on file  Social Connections: Not on file  Intimate Partner Violence: Not on file  Notes no cigarette smoking or vaping. Minimal social alcohol use. No IV drug use.  FAMILY HISTORY: Family History  Problem Relation Age of Onset   Hypertension Mother    Hyperlipidemia Father    Hypertension Father    Hypertension Maternal Grandmother    Diabetes Maternal Grandfather    Birth defects Paternal Grandmother    Hyperlipidemia Paternal Grandmother    Hypertension Paternal Grandmother    Hypertension Paternal Grandfather    Alcohol abuse Neg Hx    Arthritis Neg Hx    Asthma Neg Hx    Cancer Neg Hx    COPD Neg Hx    Depression Neg Hx    Drug abuse Neg Hx    Early death Neg Hx    Hearing loss Neg Hx    Heart disease Neg Hx    Kidney disease Neg Hx    Learning disabilities Neg Hx    Mental illness Neg Hx    Mental retardation Neg Hx    Miscarriages / Stillbirths Neg Hx    Stroke Neg Hx    Vision loss Neg Hx    Varicose Veins Neg Hx     ALLERGIES:  has No Known Allergies.  MEDICATIONS:  Current Outpatient Medications  Medication Sig Dispense Refill   albuterol (PROVENTIL HFA;VENTOLIN HFA) 108 (90 BASE) MCG/ACT inhaler Inhale 2 puffs into the lungs every 6 (six) hours as needed for wheezing or shortness of breath. 2 Inhaler 3   cetirizine (ZYRTEC) 10 MG tablet Take 1 tablet (10 mg total) by mouth daily. 30 tablet 2   ergocalciferol (VITAMIN D2) 1.25 MG (50000 UT) capsule Take 1 capsule (50,000 Units total) by mouth once a week. 12 capsule 1   ondansetron (ZOFRAN) 8 MG tablet Take 1 tablet (8 mg total) by mouth every 8 (eight) hours as needed for nausea or vomiting. 20 tablet 0   No current facility-administered medications for this visit.   Facility-Administered Medications Ordered in Other Visits  Medication Dose Route Frequency Provider Last Rate Last Admin   heparin lock flush 100 unit/mL  500 Units Intracatheter Once PRN Brunetta Genera, MD       influenza vac split quadrivalent PF (FLUARIX) injection 0.5 mL  0.5 mL Intramuscular Once Brunetta Genera, MD       riTUXimab-abbs (TRUXIMA) 800 mg in sodium chloride 0.9 % 170 mL infusion  375 mg/m2 (Order-Specific) Intravenous Once Brunetta Genera, MD       sodium chloride flush (NS) 0.9 % injection 10 mL  10 mL Intracatheter PRN Brunetta Genera, MD        REVIEW OF SYSTEMS:    .10 Point review of Systems was done is negative except as noted above.  PHYSICAL EXAMINATION: ECOG PERFORMANCE STATUS: 1 - Symptomatic but completely ambulatory VSS GENERAL:alert, in no acute distress and comfortable SKIN:  no acute rashes, no significant lesions EYES: conjunctiva are pink and non-injected, sclera anicteric OROPHARYNX: MMM, no exudates, no oropharyngeal erythema or ulceration NECK: supple, no JVD, left neck well healed surgical scar from biopsy. LYMPH:  lymphadenopathy bilateral posterior neck and left submandibular regions, right epitrochlear, bilateral inguinal regions. LUNGS: clear to auscultation b/l with normal respiratory effort HEART: regular rate & rhythm ABDOMEN:  normoactive bowel sounds , non tender, not distended.  No palpable hepatosplenomegaly Extremity: no pedal edema PSYCH: alert & oriented x 3 with fluent speech NEURO: no focal motor/sensory deficits  LABORATORY DATA:  I have reviewed the data as listed     Latest Ref Rng & Units 12/02/2022    9:41 AM 11/25/2022   11:19 AM 11/18/2022   10:11 AM  CBC  WBC 4.0 - 10.5 K/uL 4.0  5.0  4.8   Hemoglobin 13.0 - 17.0 g/dL 14.8  15.3  15.4   Hematocrit 39.0 - 52.0 % 43.8  44.9  44.6   Platelets 150 - 400 K/uL 254  258  253       Latest Ref Rng & Units 12/02/2022    9:41 AM 11/25/2022   11:19 AM 11/18/2022   10:11 AM  CMP  Glucose 70 - 99 mg/dL 91  83  91   BUN 6 - 20 mg/dL '14  15  15   '$ Creatinine 0.61 - 1.24 mg/dL 0.84  0.97  0.81   Sodium 135 - 145 mmol/L 138  139  136    Potassium 3.5 - 5.1 mmol/L 4.0  4.0  4.2   Chloride 98 - 111 mmol/L 106  107  106   CO2 22 - 32 mmol/L '26  27  25   '$ Calcium 8.9 - 10.3 mg/dL 9.9  9.9  9.2   Total Protein 6.5 - 8.1 g/dL 7.0  7.5  7.2   Total Bilirubin 0.3 - 1.2 mg/dL 1.2  0.5  1.0   Alkaline Phos 38 - 126 U/L 65  66  61   AST 15 - 41 U/L '24  21  24   '$ ALT 0 - 44 U/L '19  20  26    '$ . Lab Results  Component Value Date   LDH 127 12/02/2022        Latest Ref Rng & Units 10/02/2022    1:05 PM  Hepatitis  Hep B Surface Ag NON REACTIVE NON REACTIVE   Hep C Ab NON REACTIVE NON REACTIVE    HIV antibody 10/02/2022: Non-reactive RPR 10/02/2022: Non-reactive  Last vitamin D Lab Results  Component Value Date   VD25OH 13.45 (L) 10/02/2022    Lab Results  Component Value Date   ESRSEDRATE 3 10/02/2022    LDH 10/02/2022: 146   RADIOGRAPHIC STUDIES: I have personally reviewed the radiological images as listed and agreed with the findings in the report.  NM PET Image Initial (PI) Skull Base To Thigh Result Date: 10/26/2022 CLINICAL DATA:  Initial treatment strategy for Hodgkin's lymphoma. EXAM: NUCLEAR MEDICINE PET SKULL BASE TO THIGH TECHNIQUE: 9.82 mCi F-18 FDG was injected intravenously. Full-ring PET imaging was performed from the skull base to thigh after the radiotracer. CT data was obtained and used for attenuation correction and anatomic localization. Fasting blood glucose: 88 mg/dl COMPARISON:  CT neck June 11, 2022 FINDINGS: Mediastinal blood pool activity: SUV max 1.6 Liver activity: SUV max 2.7 NECK: Left-greater-than-right hypermetabolic cervical adenopathy. For reference: -left level 2b lymph node measures 19 x 10 mm on image 74/3 with a  max SUV of 5.6. -right level 2b lymph node measures 22 x 14 mm on image 63/3 with a max SUV of 6.0 Hypermetabolic hyperplasia of the tonsils with a max SUV of 17.4. Incidental CT findings: None. CHEST: No hypermetabolic thoracic adenopathy. No hypermetabolic pulmonary nodules or  masses. Physiologic metabolic activity in the thyroid remnant with a max SUV of 3.01. Incidental CT findings: None. ABDOMEN/PELVIS: Hypermetabolic bilateral pelvic sidewall and iliac side chain and inguinal lymph nodes. For reference: -Hypermetabolic right pelvic sidewall lymph node measures 2.5 x 0.9 cm on image 290/3 with a max SUV of 98.9. -Hypermetabolic left pelvic sidewall lymph node measures 2.2 x 1.1 cm on image 289/3 with a max SUV of 6.8. No abnormal hypermetabolic activity in the liver or spleen. Hypermetabolic descending and sigmoid colonic activity is favored physiologic. Incidental CT findings: No splenomegaly. SKELETON: No focal hypermetabolic activity to suggest skeletal metastasis. Incidental CT findings: None. IMPRESSION: 1. Hypermetabolic cervical, pelvic and inguinal adenopathy compatible with patient's known diagnosis of Hodgkin's lymphoma. 2. Hypermetabolic hyperplasia of the tonsils, likely reflects lymphomatous disease involvement. 3. No splenomegaly. Electronically Signed   By: Dahlia Bailiff M.D.   On: 10/26/2022 08:55    CT neck 06/11/2022 IMPRESSION:  Cluster of homogeneously enlarged lymph nodes in the left neck, largest being in the submandibular space at up to 3.2 cm. Lymphoma or granulomatous disease are the leading considerations.   CT Maxillofacial 19/28/2017 IMPRESSION: Mild bilateral maxillary sinusitis. Mild bilateral posterior cervical adenopathy is noted, as well as significantly enlarged left submandibular adenopathy. This may simply be inflammatory in etiology, but neoplasm cannot be excluded. Clinical correlation is recommended.  ASSESSMENT & PLAN:   26 y.o. 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 -This would be in keeping with the slow progression of nodular lymphocyte predominant Hodgkin's lymphoma. -PET CT 10/22/2022 revealed Hypermetabolic cervical, pelvic and inguinal adenopathy as well as tonsillar enlargement.   PLAN: -Discussed lab results from 12/02/2022 with the patient. CMP and CBC stable. - no notably toxicities from current Rituxan treatment regimen. Notes clinically much improved neck LNadenopathy. -Proceed with Rituximab with the same dosage and same supportive medications. Getting rapid Rituxan protocol. -treatment orders reviewed and signed. -Plan PET/CT scan in 9 weeks before next visit.  -Recommended influenza vaccine. -recommended infection prevention strategies and precautions.  FOLLOW UP: PET/CT in 9 weeks RTC with Dr Irene Limbo with labs in 10 weeks  The total time spent in the appointment was 30 minutes* .  All of the patient's questions were answered with apparent satisfaction. The patient knows to call the clinic with any problems, questions or concerns.   Sullivan Lone MD MS AAHIVMS [facility] Hematology/Oncology Physician [facility]  .*Total Encounter Time as defined by the Centers for Medicare and Medicaid Services includes, in addition to the face-to-face time of a patient visit (documented in the note above) non-face-to-face time: obtaining and reviewing outside history, ordering and reviewing medications, tests or procedures, care coordination (communications with other health care professionals or caregivers) and documentation in the medical record.   I,Param Shah,am acting as a scribe for Sullivan Lone, MD .I have reviewed the above documentation for accuracy and completeness, and I agree with the above. Brunetta Genera MD

## 2022-12-04 ENCOUNTER — Other Ambulatory Visit: Payer: Self-pay

## 2022-12-08 ENCOUNTER — Encounter: Payer: Self-pay | Admitting: Hematology

## 2022-12-30 ENCOUNTER — Telehealth: Payer: Self-pay

## 2022-12-30 NOTE — Telephone Encounter (Signed)
Notified Patient of completion of Intermittent FMLA and Fitness For Duty Certification forms. Fax transmission confirmations received. Copy of forms emailed to patient as requested. No other needs or concerns voiced at this time.

## 2023-02-01 ENCOUNTER — Other Ambulatory Visit: Payer: Self-pay

## 2023-02-01 DIAGNOSIS — C8108 Nodular lymphocyte predominant Hodgkin lymphoma, lymph nodes of multiple sites: Secondary | ICD-10-CM

## 2023-02-03 ENCOUNTER — Inpatient Hospital Stay: Payer: BC Managed Care – PPO | Attending: Hematology

## 2023-02-03 ENCOUNTER — Other Ambulatory Visit: Payer: Self-pay

## 2023-02-03 DIAGNOSIS — C8101 Nodular lymphocyte predominant Hodgkin lymphoma, lymph nodes of head, face, and neck: Secondary | ICD-10-CM | POA: Insufficient documentation

## 2023-02-03 DIAGNOSIS — Z9221 Personal history of antineoplastic chemotherapy: Secondary | ICD-10-CM | POA: Diagnosis not present

## 2023-02-03 DIAGNOSIS — C8108 Nodular lymphocyte predominant Hodgkin lymphoma, lymph nodes of multiple sites: Secondary | ICD-10-CM

## 2023-02-03 LAB — CBC WITH DIFFERENTIAL (CANCER CENTER ONLY)
Abs Immature Granulocytes: 0.01 10*3/uL (ref 0.00–0.07)
Basophils Absolute: 0 10*3/uL (ref 0.0–0.1)
Basophils Relative: 0 %
Eosinophils Absolute: 0.2 10*3/uL (ref 0.0–0.5)
Eosinophils Relative: 4 %
HCT: 45.1 % (ref 39.0–52.0)
Hemoglobin: 15.7 g/dL (ref 13.0–17.0)
Immature Granulocytes: 0 %
Lymphocytes Relative: 46 %
Lymphs Abs: 1.7 10*3/uL (ref 0.7–4.0)
MCH: 29.1 pg (ref 26.0–34.0)
MCHC: 34.8 g/dL (ref 30.0–36.0)
MCV: 83.5 fL (ref 80.0–100.0)
Monocytes Absolute: 0.4 10*3/uL (ref 0.1–1.0)
Monocytes Relative: 11 %
Neutro Abs: 1.5 10*3/uL — ABNORMAL LOW (ref 1.7–7.7)
Neutrophils Relative %: 39 %
Platelet Count: 259 10*3/uL (ref 150–400)
RBC: 5.4 MIL/uL (ref 4.22–5.81)
RDW: 11.7 % (ref 11.5–15.5)
WBC Count: 3.8 10*3/uL — ABNORMAL LOW (ref 4.0–10.5)
nRBC: 0 % (ref 0.0–0.2)

## 2023-02-03 LAB — CMP (CANCER CENTER ONLY)
ALT: 12 U/L (ref 0–44)
AST: 14 U/L — ABNORMAL LOW (ref 15–41)
Albumin: 4 g/dL (ref 3.5–5.0)
Alkaline Phosphatase: 58 U/L (ref 38–126)
Anion gap: 3 — ABNORMAL LOW (ref 5–15)
BUN: 12 mg/dL (ref 6–20)
CO2: 29 mmol/L (ref 22–32)
Calcium: 9.5 mg/dL (ref 8.9–10.3)
Chloride: 107 mmol/L (ref 98–111)
Creatinine: 0.94 mg/dL (ref 0.61–1.24)
GFR, Estimated: >60 mL/min (ref >60)
Glucose, Bld: 83 mg/dL (ref 70–99)
Potassium: 4 mmol/L (ref 3.5–5.1)
Sodium: 139 mmol/L (ref 135–145)
Total Bilirubin: 0.8 mg/dL (ref 0.3–1.2)
Total Protein: 6.4 g/dL — ABNORMAL LOW (ref 6.5–8.1)

## 2023-02-03 LAB — LACTATE DEHYDROGENASE: LDH: 101 U/L (ref 98–192)

## 2023-02-04 ENCOUNTER — Encounter (HOSPITAL_COMMUNITY)
Admission: RE | Admit: 2023-02-04 | Discharge: 2023-02-04 | Disposition: A | Discharge status: Home / Self Care | Payer: BC Managed Care – PPO | Source: Ambulatory Visit | Attending: Hematology | Admitting: Hematology

## 2023-02-04 DIAGNOSIS — C8108 Nodular lymphocyte predominant Hodgkin lymphoma, lymph nodes of multiple sites: Secondary | ICD-10-CM | POA: Insufficient documentation

## 2023-02-04 LAB — GLUCOSE, CAPILLARY: Glucose-Capillary: 86 mg/dL (ref 70–99)

## 2023-02-04 MED ORDER — FLUDEOXYGLUCOSE F - 18 (FDG) INJECTION
9.8700 | Freq: Once | INTRAVENOUS | Status: AC | PRN
  Administered 2023-02-04: 9.87 via INTRAVENOUS

## 2023-02-10 ENCOUNTER — Other Ambulatory Visit: Payer: Self-pay

## 2023-02-10 ENCOUNTER — Inpatient Hospital Stay (HOSPITAL_BASED_OUTPATIENT_CLINIC_OR_DEPARTMENT_OTHER): Payer: BC Managed Care – PPO | Admitting: Hematology

## 2023-02-10 VITALS — BP 115/61 | HR 60 | Temp 97.9°F | Resp 20 | Wt 203.5 lb

## 2023-02-10 DIAGNOSIS — C8108 Nodular lymphocyte predominant Hodgkin lymphoma, lymph nodes of multiple sites: Secondary | ICD-10-CM

## 2023-02-10 DIAGNOSIS — C8101 Nodular lymphocyte predominant Hodgkin lymphoma, lymph nodes of head, face, and neck: Secondary | ICD-10-CM | POA: Diagnosis not present

## 2023-02-10 MED ORDER — ERGOCALCIFEROL 1.25 MG (50000 UT) PO CAPS: 50000.0000 [IU] | ORAL_CAPSULE | ORAL | 3 refills | Status: AC

## 2023-02-10 NOTE — Progress Notes (Signed)
HEMATOLOGY/ONCOLOGY CLINIC NOTE  Date of Service: 02/10/23  Patient Care Team: Marcha Solders, MD as PCP - General  CHIEF COMPLAINTS:  Nodular lymphocyte predominant Hodgkin's lymphoma  HISTORY OF PRESENTING ILLNESS:   Benjamin Doyle is a wonderful 27 y.o. male who has been referred to [place] by Dr Melida Quitter for evaluation and management of newly diagnosed nodular lymphocyte predominant Hodgkin's lymphoma.  Patient has no significant chronic medical issues and was seen by Dr. Melida Quitter for initial consultation on 06/04/2022 with increasing size of left upper neck lymph nodes. He notes that he has had enlarged lymph nodes in his neck on and off all the time in his neck and groin as well as under the armpits for the last 5+ years but recently the left neck lymph nodes were getting larger since about April 2023. Patient notes that he has been aggressively working out and did lose some weight on account of dietary changes and extensive physical exercise regimen and feels that this could have also made his lymph nodes feel more prominent.  Of note he did have a CT of the maxillofacial region with Dr. Leonard Schwartz MD in October 2017 which showed mild bilateral posterior cervical lymphadenopathy as well as significantly enlarged left submandibular adenopathy.  The largest lymph node measured 17 x 11 mm in size in the left submandibular region. this may be inflammatory in etiology but neoplasm cannot be ruled out.  After seeing Dr. Melida Quitter the patient had a CT of the neck on 06/11/2022 which showed a cluster of homogeneously enlarged lymph nodes in the left neck with the largest being in the submandibular space at about 3.2 cm.  Lymphoma or granulomatous disease are the leading considerations.  Patient eventually had a surgical biopsy of his left submandibular lymph node on 09/21/2022 with Dr. Melida Quitter which showed nodular lymphocyte predominant Hodgkin's lymphoma.  Patient notes  weight loss of about 10-15 pounds over the last couple of years though this is in the context of dietary changes and heavy physical exercise.  No significant new fatigue.  No unexplained fevers or chills.  No drenching night sweats. Acute chest pain or shortness of breath. No abdominal pain or distention. No focal bone pains or generalized bone pains. Skin rashes or pruritus. Denies any concerning unsafe sexual exposure or body fluid exposure. Denies use of IV drugs.   INTERVAL HISTORY:  Benjamin Doyle is a 27 y.o. male who is here today for continued evaluation and management of nodular lymphocyte predominant Hodgkin's lymphoma.   Patient was last seen by me on 12/02/2022 and he was doing well overall and was tolerating his treatment without any toxicities.   Patient is accompanied by his mother during this visit. He reports he has been doing well without any new medical concerns since our last visit. He denies fever, chills, night sweats, unexpected weight loss, new enlarged lymph nodes, abdominal pain, back pain, chest pain, or leg swelling.   He reports of small enlarged lymph node near his right arm, but not concerning because it has been decreasing in size.   He tolerated Rituximab well without any toxicities.   He has received his influenza vaccine.   MEDICAL HISTORY:  Past Medical History:  Diagnosis Date   Asthma    Heart murmur     SURGICAL HISTORY: Past Surgical History:  Procedure Laterality Date   TYMPANOSTOMY TUBE PLACEMENT      SOCIAL HISTORY: Social History   Socioeconomic History  Marital status: Single    Spouse name: Not on file   Number of children: Not on file   Years of education: Not on file   Highest education level: Not on file  Occupational History   Not on file  Tobacco Use   Smoking status: Never   Smokeless tobacco: Never  Substance and Sexual Activity   Alcohol use: No   Drug use: No   Sexual activity: Not on file  Other  Topics Concern   Not on file  Social History Narrative   Not on file   Social Determinants of Health   Financial Resource Strain: Not on file  Food Insecurity: Not on file  Transportation Needs: Not on file  Physical Activity: Not on file  Stress: Not on file  Social Connections: Not on file  Intimate Partner Violence: Not on file  Notes no cigarette smoking or vaping. Minimal social alcohol use. No IV drug use.  FAMILY HISTORY: Family History  Problem Relation Age of Onset   Hypertension Mother    Hyperlipidemia Father    Hypertension Father    Hypertension Maternal Grandmother    Diabetes Maternal Grandfather    Birth defects Paternal Grandmother    Hyperlipidemia Paternal Grandmother    Hypertension Paternal Grandmother    Hypertension Paternal Grandfather    Alcohol abuse Neg Hx    Arthritis Neg Hx    Asthma Neg Hx    Cancer Neg Hx    COPD Neg Hx    Depression Neg Hx    Drug abuse Neg Hx    Early death Neg Hx    Hearing loss Neg Hx    Heart disease Neg Hx    Kidney disease Neg Hx    Learning disabilities Neg Hx    Mental illness Neg Hx    Mental retardation Neg Hx    Miscarriages / Stillbirths Neg Hx    Stroke Neg Hx    Vision loss Neg Hx    Varicose Veins Neg Hx     ALLERGIES:  has No Known Allergies.  MEDICATIONS:  Current Outpatient Medications  Medication Sig Dispense Refill   albuterol (PROVENTIL HFA;VENTOLIN HFA) 108 (90 BASE) MCG/ACT inhaler Inhale 2 puffs into the lungs every 6 (six) hours as needed for wheezing or shortness of breath. 2 Inhaler 3   cetirizine (ZYRTEC) 10 MG tablet Take 1 tablet (10 mg total) by mouth daily. 30 tablet 2   ergocalciferol (VITAMIN D2) 1.25 MG (50000 UT) capsule Take 1 capsule (50,000 Units total) by mouth once a week. 12 capsule 1   ondansetron (ZOFRAN) 8 MG tablet Take 1 tablet (8 mg total) by mouth every 8 (eight) hours as needed for nausea or vomiting. 20 tablet 0   No current facility-administered medications  for this visit.    REVIEW OF SYSTEMS:    .10 Point review of Systems was done is negative except as noted above.  PHYSICAL EXAMINATION: ECOG PERFORMANCE STATUS: 1 - Symptomatic but completely ambulatory VSS GENERAL:alert, in no acute distress and comfortable SKIN: no acute rashes, no significant lesions EYES: conjunctiva are pink and non-injected, sclera anicteric OROPHARYNX: MMM, no exudates, no oropharyngeal erythema or ulceration NECK: supple, no JVD, left neck well healed surgical scar from biopsy. LYMPH:  lymphadenopathy bilateral posterior neck and left submandibular regions, right epitrochlear, bilateral inguinal regions. LUNGS: clear to auscultation b/l with normal respiratory effort HEART: regular rate & rhythm ABDOMEN:  normoactive bowel sounds , non tender, not distended.  No palpable hepatosplenomegaly  Extremity: no pedal edema PSYCH: alert & oriented x 3 with fluent speech NEURO: no focal motor/sensory deficits  LABORATORY DATA:  I have reviewed the data as listed     Latest Ref Rng & Units 02/03/2023   10:25 AM 12/02/2022    9:41 AM 11/25/2022   11:19 AM  CBC  WBC 4.0 - 10.5 K/uL 3.8  4.0  5.0   Hemoglobin 13.0 - 17.0 g/dL 15.7  14.8  15.3   Hematocrit 39.0 - 52.0 % 45.1  43.8  44.9   Platelets 150 - 400 K/uL 259  254  258       Latest Ref Rng & Units 02/03/2023   10:25 AM 12/02/2022    9:41 AM 11/25/2022   11:19 AM  CMP  Glucose 70 - 99 mg/dL 83  91  83   BUN 6 - 20 mg/dL 12  14  15   $ Creatinine 0.61 - 1.24 mg/dL 0.94  0.84  0.97   Sodium 135 - 145 mmol/L 139  138  139   Potassium 3.5 - 5.1 mmol/L 4.0  4.0  4.0   Chloride 98 - 111 mmol/L 107  106  107   CO2 22 - 32 mmol/L 29  26  27   $ Calcium 8.9 - 10.3 mg/dL 9.5  9.9  9.9   Total Protein 6.5 - 8.1 g/dL 6.4  7.0  7.5   Total Bilirubin 0.3 - 1.2 mg/dL 0.8  1.2  0.5   Alkaline Phos 38 - 126 U/L 58  65  66   AST 15 - 41 U/L 14  24  21   $ ALT 0 - 44 U/L 12  19  20    $ . Lab Results  Component Value Date    LDH 101 02/03/2023        Latest Ref Rng & Units 10/02/2022    1:05 PM  Hepatitis  Hep B Surface Ag NON REACTIVE NON REACTIVE   Hep C Ab NON REACTIVE NON REACTIVE    HIV antibody 10/02/2022: Non-reactive RPR 10/02/2022: Non-reactive  Last vitamin D Lab Results  Component Value Date   VD25OH 13.45 (L) 10/02/2022    Lab Results  Component Value Date   ESRSEDRATE 3 10/02/2022    LDH 10/02/2022: 146   RADIOGRAPHIC STUDIES: I have personally reviewed the radiological images as listed and agreed with the findings in the report.  NM PET Image Initial (PI) Skull Base To Thigh Result Date: 10/26/2022 CLINICAL DATA:  Initial treatment strategy for Hodgkin's lymphoma. EXAM: NUCLEAR MEDICINE PET SKULL BASE TO THIGH TECHNIQUE: 9.82 mCi F-18 FDG was injected intravenously. Full-ring PET imaging was performed from the skull base to thigh after the radiotracer. CT data was obtained and used for attenuation correction and anatomic localization. Fasting blood glucose: 88 mg/dl COMPARISON:  CT neck June 11, 2022 FINDINGS: Mediastinal blood pool activity: SUV max 1.6 Liver activity: SUV max 2.7 NECK: Left-greater-than-right hypermetabolic cervical adenopathy. For reference: -left level 2b lymph node measures 19 x 10 mm on image 74/3 with a max SUV of 5.6. -right level 2b lymph node measures 22 x 14 mm on image 63/3 with a max SUV of 6.0 Hypermetabolic hyperplasia of the tonsils with a max SUV of 17.4. Incidental CT findings: None. CHEST: No hypermetabolic thoracic adenopathy. No hypermetabolic pulmonary nodules or masses. Physiologic metabolic activity in the thyroid remnant with a max SUV of 3.01. Incidental CT findings: None. ABDOMEN/PELVIS: Hypermetabolic bilateral pelvic sidewall and iliac side chain and inguinal lymph  nodes. For reference: -Hypermetabolic right pelvic sidewall lymph node measures 2.5 x 0.9 cm on image 290/3 with a max SUV of [id number]. -Hypermetabolic left pelvic sidewall lymph node  measures 2.2 x 1.1 cm on image 289/3 with a max SUV of 6.8. No abnormal hypermetabolic activity in the liver or spleen. Hypermetabolic descending and sigmoid colonic activity is favored physiologic. Incidental CT findings: No splenomegaly. SKELETON: No focal hypermetabolic activity to suggest skeletal metastasis. Incidental CT findings: None. IMPRESSION: 1. Hypermetabolic cervical, pelvic and inguinal adenopathy compatible with patient's known diagnosis of Hodgkin's lymphoma. 2. Hypermetabolic hyperplasia of the tonsils, likely reflects lymphomatous disease involvement. 3. No splenomegaly. Electronically Signed   By: Dahlia Bailiff M.D.   On: 10/26/2022 08:55    CT neck 06/11/2022 IMPRESSION:  Cluster of homogeneously enlarged lymph nodes in the left neck, largest being in the submandibular space at up to 3.2 cm. Lymphoma or granulomatous disease are the leading considerations.   CT Maxillofacial 19/28/2017 IMPRESSION: Mild bilateral maxillary sinusitis. Mild bilateral posterior cervical adenopathy is noted, as well as significantly enlarged left submandibular adenopathy. This may simply be inflammatory in etiology, but neoplasm cannot be excluded. Clinical correlation is recommended.  ASSESSMENT & PLAN:   27 y.o. 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 -This would be in keeping with the slow progression of nodular lymphocyte predominant Hodgkin's lymphoma. -PET CT 10/22/2022 revealed Hypermetabolic cervical, pelvic and inguinal adenopathy as well as tonsillar enlargement.   PLAN: -Discussed lab results from 02/03/2023 with the patient. CBC shows slightly decreased WBC of 3.8. CMP is stable. LDH is in the normal range.  -Discussed  the recent PET scan results with the patient, which showed decreased size and FDG avidity of the cervical, pelvic and inguinal lymph nodes, consistent with treatment response. Persistent hypermetabolic hyperplasia of the tonsils may be reactive or reflect disease involvement. No new or progressive hypermetabolic disease. Tonsil is slightly lightening up on the PET scan.   -Answered all of patient's questions regarding PET scan results and general questions regarding his lymphoma.  -Discussed that we will continue to observe.  -Discussed that we will get CT scan in one year and next visit with [place] in 6 months with labs. -Recommended to start vitamin-D2 supplements.  -Recommended to stay well-hydrated, drinking around 2 L of water, and eat well.   FOLLOW UP: RTC with Dr Irene Limbo with labs in 6 months   The total time spent in the appointment was 23 minutes* .  All of the patient's questions were answered with apparent satisfaction. The patient knows to call the clinic with any problems, questions or concerns.   Sullivan Lone MD MS AAHIVMS [facility] Hematology/Oncology Physician [facility]  .*Total Encounter Time as defined by the Centers for Medicare and Medicaid Services includes, in addition to the face-to-face time of a patient visit (documented in the note above) non-face-to-face time: obtaining and reviewing outside history, ordering and reviewing medications, tests or procedures, care coordination (communications with other health care professionals or caregivers) and documentation in the medical record.   I, Cleda Mccreedy, am acting as a Education administrator for Sullivan Lone, MD. .I have reviewed the above documentation for accuracy and completeness, and I agree with the above.  Brunetta Genera MD

## 2023-02-11 ENCOUNTER — Other Ambulatory Visit: Payer: Self-pay

## 2023-02-11 ENCOUNTER — Telehealth: Payer: Self-pay | Admitting: Hematology

## 2023-02-11 NOTE — Telephone Encounter (Signed)
Called patient per 2/14 los notes to schedule f/u. Left voicemail with new appointment information and contact details if needing to reschedule.

## 2023-02-12 ENCOUNTER — Other Ambulatory Visit: Payer: Self-pay

## 2023-02-16 ENCOUNTER — Encounter: Payer: Self-pay | Admitting: Hematology

## 2023-03-30 ENCOUNTER — Other Ambulatory Visit: Payer: Self-pay

## 2023-08-10 ENCOUNTER — Other Ambulatory Visit: Payer: Self-pay

## 2023-08-10 DIAGNOSIS — C8108 Nodular lymphocyte predominant Hodgkin lymphoma, lymph nodes of multiple sites: Secondary | ICD-10-CM

## 2023-08-11 ENCOUNTER — Other Ambulatory Visit: Payer: Self-pay

## 2023-08-11 ENCOUNTER — Inpatient Hospital Stay (HOSPITAL_BASED_OUTPATIENT_CLINIC_OR_DEPARTMENT_OTHER): Payer: BC Managed Care – PPO | Admitting: Hematology

## 2023-08-11 ENCOUNTER — Inpatient Hospital Stay: Payer: BC Managed Care – PPO | Attending: Hematology

## 2023-08-11 VITALS — BP 113/51 | HR 67 | Temp 98.1°F | Resp 20 | Wt 204.1 lb

## 2023-08-11 DIAGNOSIS — Z8572 Personal history of non-Hodgkin lymphomas: Secondary | ICD-10-CM | POA: Diagnosis present

## 2023-08-11 DIAGNOSIS — C8108 Nodular lymphocyte predominant Hodgkin lymphoma, lymph nodes of multiple sites: Secondary | ICD-10-CM | POA: Diagnosis not present

## 2023-08-11 LAB — CBC WITH DIFFERENTIAL (CANCER CENTER ONLY)
Abs Immature Granulocytes: 0.01 10*3/uL (ref 0.00–0.07)
Basophils Absolute: 0 10*3/uL (ref 0.0–0.1)
Basophils Relative: 0 %
Eosinophils Absolute: 0.2 10*3/uL (ref 0.0–0.5)
Eosinophils Relative: 4 %
HCT: 44.8 % (ref 39.0–52.0)
Hemoglobin: 15.4 g/dL (ref 13.0–17.0)
Immature Granulocytes: 0 %
Lymphocytes Relative: 36 %
Lymphs Abs: 1.6 10*3/uL (ref 0.7–4.0)
MCH: 28.8 pg (ref 26.0–34.0)
MCHC: 34.4 g/dL (ref 30.0–36.0)
MCV: 83.9 fL (ref 80.0–100.0)
Monocytes Absolute: 0.3 10*3/uL (ref 0.1–1.0)
Monocytes Relative: 7 %
Neutro Abs: 2.2 10*3/uL (ref 1.7–7.7)
Neutrophils Relative %: 53 %
Platelet Count: 240 10*3/uL (ref 150–400)
RBC: 5.34 MIL/uL (ref 4.22–5.81)
RDW: 12 % (ref 11.5–15.5)
WBC Count: 4.3 10*3/uL (ref 4.0–10.5)
nRBC: 0 % (ref 0.0–0.2)

## 2023-08-11 LAB — CMP (CANCER CENTER ONLY)
ALT: 18 U/L (ref 0–44)
AST: 19 U/L (ref 15–41)
Albumin: 4.3 g/dL (ref 3.5–5.0)
Alkaline Phosphatase: 70 U/L (ref 38–126)
Anion gap: 1 — ABNORMAL LOW (ref 5–15)
BUN: 16 mg/dL (ref 6–20)
CO2: 30 mmol/L (ref 22–32)
Calcium: 9.1 mg/dL (ref 8.9–10.3)
Chloride: 107 mmol/L (ref 98–111)
Creatinine: 1 mg/dL (ref 0.61–1.24)
GFR, Estimated: >60 mL/min (ref >60)
Glucose, Bld: 79 mg/dL (ref 70–99)
Potassium: 4.2 mmol/L (ref 3.5–5.1)
Sodium: 138 mmol/L (ref 135–145)
Total Bilirubin: 1 mg/dL (ref 0.3–1.2)
Total Protein: 7 g/dL (ref 6.5–8.1)

## 2023-08-11 LAB — LACTATE DEHYDROGENASE: LDH: 126 U/L (ref 98–192)

## 2023-08-12 ENCOUNTER — Other Ambulatory Visit: Payer: Self-pay

## 2023-08-14 ENCOUNTER — Other Ambulatory Visit: Payer: Self-pay

## 2023-08-17 ENCOUNTER — Encounter: Payer: Self-pay | Admitting: Hematology

## 2023-08-17 NOTE — Progress Notes (Signed)
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 male who has been referred to [place] by Dr Christia Reading for evaluation and management of newly diagnosed nodular lymphocyte predominant Hodgkin's lymphoma.  Patient has no significant chronic medical issues and was seen by Dr. Christia Reading for initial consultation on 06/04/2022 with increasing size of left upper neck lymph nodes. He notes that he has had enlarged lymph nodes in his neck on and off all the time in his neck and groin as well as under the armpits for the last 5+ years but recently the left neck lymph nodes were getting larger since about April 2023. Patient notes that he has been aggressively working out and did lose some weight on account of dietary changes and extensive physical exercise regimen and feels that this could have also made his lymph nodes feel more prominent.  Of note he did have a CT of the maxillofacial region with Dr. Nelva Nay MD in October 2017 which showed mild bilateral posterior cervical lymphadenopathy as well as significantly enlarged left submandibular adenopathy.  The largest lymph node measured 17 x 11 mm in size in the left submandibular region. this may be inflammatory in etiology but neoplasm cannot be ruled out.  After seeing Dr. Christia Reading the patient had a CT of the neck on 06/11/2022 which showed a cluster of homogeneously enlarged lymph nodes in the left neck with the largest being in the submandibular space at about 3.2 cm.  Lymphoma or granulomatous disease are the leading considerations.  Patient eventually had a surgical biopsy of his left submandibular lymph node on 09/21/2022 with Dr. Christia Reading which showed nodular lymphocyte predominant Hodgkin's  lymphoma.  Patient notes weight loss of about 10-15 pounds over the last couple of years though this is in the context of dietary changes and heavy physical exercise.  No significant new fatigue.  No unexplained fevers or chills.  No drenching night sweats. Acute chest pain or shortness of breath. No abdominal pain or distention. No focal bone pains or generalized bone pains. Skin rashes or pruritus. Denies any concerning unsafe sexual exposure or body fluid exposure. Denies use of IV drugs.   INTERVAL HISTORY:  Benjamin Doyle is a 27 y.o. male is here for his 25-month follow-up for his nodular lymphocyte predominant Hodgkin's lymphoma. He notes that he has been feeling well and has been actively working out and eating healthy. He notes good energy levels good appetite and no new focal symptoms. No fevers no chills no night sweats. No new lumps or bumps. No residual treatment-related toxicities.  MEDICAL HISTORY:  Past Medical History:  Diagnosis Date   Asthma    Heart murmur     SURGICAL HISTORY: Past Surgical History:  Procedure Laterality Date   TYMPANOSTOMY TUBE PLACEMENT      SOCIAL HISTORY: Social History   Socioeconomic History   Marital status: Single    Spouse name: Not on file   Number of children: Not on file   Years of education: Not on file   Highest education level: Not on file  Occupational History   Not on file  Tobacco Use   Smoking status: Never   Smokeless tobacco: Never  Substance and Sexual Activity   Alcohol use: No  Drug use: No   Sexual activity: Not on file  Other Topics Concern   Not on file  Social History Narrative   Not on file   Social Determinants of Health   Financial Resource Strain: Not on file  Food Insecurity: Not on file  Transportation Needs: Not on file  Physical Activity: Not on file  Stress: Not on file  Social Connections: Not on file  Intimate Partner Violence: Not on file  Notes no cigarette smoking or  vaping. Minimal social alcohol use. No IV drug use.  FAMILY HISTORY: Family History  Problem Relation Age of Onset   Hypertension Mother    Hyperlipidemia Father    Hypertension Father    Hypertension Maternal Grandmother    Diabetes Maternal Grandfather    Birth defects Paternal Grandmother    Hyperlipidemia Paternal Grandmother    Hypertension Paternal Grandmother    Hypertension Paternal Grandfather    Alcohol abuse Neg Hx    Arthritis Neg Hx    Asthma Neg Hx    Cancer Neg Hx    COPD Neg Hx    Depression Neg Hx    Drug abuse Neg Hx    Early death Neg Hx    Hearing loss Neg Hx    Heart disease Neg Hx    Kidney disease Neg Hx    Learning disabilities Neg Hx    Mental illness Neg Hx    Mental retardation Neg Hx    Miscarriages / Stillbirths Neg Hx    Stroke Neg Hx    Vision loss Neg Hx    Varicose Veins Neg Hx     ALLERGIES:  has No Known Allergies.  MEDICATIONS:  Current Outpatient Medications  Medication Sig Dispense Refill   albuterol (PROVENTIL HFA;VENTOLIN HFA) 108 (90 BASE) MCG/ACT inhaler Inhale 2 puffs into the lungs every 6 (six) hours as needed for wheezing or shortness of breath. 2 Inhaler 3   cetirizine (ZYRTEC) 10 MG tablet Take 1 tablet (10 mg total) by mouth daily. 30 tablet 2   ergocalciferol (VITAMIN D2) 1.25 MG (50000 UT) capsule Take 1 capsule (50,000 Units total) by mouth once a week. 12 capsule 3   ondansetron (ZOFRAN) 8 MG tablet Take 1 tablet (8 mg total) by mouth every 8 (eight) hours as needed for nausea or vomiting. 20 tablet 0   No current facility-administered medications for this visit.    REVIEW OF SYSTEMS:    .10 Point review of Systems was done is negative except as noted above.   PHYSICAL EXAMINATION: ECOG PERFORMANCE STATUS: 1 - Symptomatic but completely ambulatory .BP (!) 113/51   Pulse 67   Temp 98.1 F (36.7 C)   Resp 20   Wt 204 lb 1.6 oz (92.6 kg)   SpO2 100%   BMI 25.51 kg/m  NAD GENERAL:alert, in no acute  distress and comfortable SKIN: no acute rashes, no significant lesions EYES: conjunctiva are pink and non-injected, sclera anicteric OROPHARYNX: MMM, no exudates, no oropharyngeal erythema or ulceration NECK: supple, no JVD LYMPH:  no palpable lymphadenopathy in the cervical, axillary or inguinal regions LUNGS: clear to auscultation b/l with normal respiratory effort HEART: regular rate & rhythm ABDOMEN:  normoactive bowel sounds , non tender, not distended. Extremity: no pedal edema PSYCH: alert & oriented x 3 with fluent speech NEURO: no focal motor/sensory deficits   LABORATORY DATA:  I have reviewed the data as listed     Latest Ref Rng & Units 08/11/2023   10:02 AM 02/03/2023  10:25 AM 12/02/2022    9:41 AM  CBC  WBC 4.0 - 10.5 K/uL 4.3  3.8  4.0   Hemoglobin 13.0 - 17.0 g/dL 84.1  32.4  40.1   Hematocrit 39.0 - 52.0 % 44.8  45.1  43.8   Platelets 150 - 400 K/uL 240  259  254       Latest Ref Rng & Units 08/11/2023   10:02 AM 02/03/2023   10:25 AM 12/02/2022    9:41 AM  CMP  Glucose 70 - 99 mg/dL 79  83  91   BUN 6 - 20 mg/dL 16  12  14    Creatinine 0.61 - 1.24 mg/dL 0.27  2.53  6.64   Sodium 135 - 145 mmol/L 138  139  138   Potassium 3.5 - 5.1 mmol/L 4.2  4.0  4.0   Chloride 98 - 111 mmol/L 107  107  106   CO2 22 - 32 mmol/L 30  29  26    Calcium 8.9 - 10.3 mg/dL 9.1  9.5  9.9   Total Protein 6.5 - 8.1 g/dL 7.0  6.4  7.0   Total Bilirubin 0.3 - 1.2 mg/dL 1.0  0.8  1.2   Alkaline Phos 38 - 126 U/L 70  58  65   AST 15 - 41 U/L 19  14  24    ALT 0 - 44 U/L 18  12  19     . Lab Results  Component Value Date   LDH 126 08/11/2023        Latest Ref Rng & Units 10/02/2022    1:05 PM  Hepatitis  Hep B Surface Ag NON REACTIVE NON REACTIVE   Hep C Ab NON REACTIVE NON REACTIVE    HIV antibody 10/02/2022: Non-reactive RPR 10/02/2022: Non-reactive  Last vitamin D Lab Results  Component Value Date   VD25OH 13.45 (L) 10/02/2022    Lab Results  Component Value Date    ESRSEDRATE 3 10/02/2022    LDH 10/02/2022: 146   RADIOGRAPHIC STUDIES: I have personally reviewed the radiological images as listed and agreed with the findings in the report.  NM PET Image Initial (PI) Skull Base To Thigh Result Date: 10/26/2022 CLINICAL DATA:  Initial treatment strategy for Hodgkin's lymphoma. EXAM: NUCLEAR MEDICINE PET SKULL BASE TO THIGH TECHNIQUE: 9.82 mCi F-18 FDG was injected intravenously. Full-ring PET imaging was performed from the skull base to thigh after the radiotracer. CT data was obtained and used for attenuation correction and anatomic localization. Fasting blood glucose: 88 mg/dl COMPARISON:  CT neck June 11, 2022 FINDINGS: Mediastinal blood pool activity: SUV max 1.6 Liver activity: SUV max 2.7 NECK: Left-greater-than-right hypermetabolic cervical adenopathy. For reference: -left level 2b lymph node measures 19 x 10 mm on image 74/3 with a max SUV of 5.6. -right level 2b lymph node measures 22 x 14 mm on image 63/3 with a max SUV of 6.0 Hypermetabolic hyperplasia of the tonsils with a max SUV of 17.4. Incidental CT findings: None. CHEST: No hypermetabolic thoracic adenopathy. No hypermetabolic pulmonary nodules or masses. Physiologic metabolic activity in the thyroid remnant with a max SUV of 3.01. Incidental CT findings: None. ABDOMEN/PELVIS: Hypermetabolic bilateral pelvic sidewall and iliac side chain and inguinal lymph nodes. For reference: -Hypermetabolic right pelvic sidewall lymph node measures 2.5 x 0.9 cm on image 290/3 with a max SUV of 14.8. -Hypermetabolic left pelvic sidewall lymph node measures 2.2 x 1.1 cm on image 289/3 with a max SUV of 6.8. No abnormal hypermetabolic  activity in the liver or spleen. Hypermetabolic descending and sigmoid colonic activity is favored physiologic. Incidental CT findings: No splenomegaly. SKELETON: No focal hypermetabolic activity to suggest skeletal metastasis. Incidental CT findings: None. IMPRESSION: 1. Hypermetabolic  cervical, pelvic and inguinal adenopathy compatible with patient's known diagnosis of Hodgkin's lymphoma. 2. Hypermetabolic hyperplasia of the tonsils, likely reflects lymphomatous disease involvement. 3. No splenomegaly. Electronically Signed   By: Maudry Mayhew M.D.   On: 10/26/2022 08:55    CT neck 06/11/2022 IMPRESSION:  Cluster of homogeneously enlarged lymph nodes in the left neck, largest being in the submandibular space at up to 3.2 cm. Lymphoma or granulomatous disease are the leading considerations.   CT Maxillofacial 19/28/2017 IMPRESSION: Mild bilateral maxillary sinusitis. Mild bilateral posterior cervical adenopathy is noted, as well as significantly enlarged left submandibular adenopathy. This may simply be inflammatory in etiology, but neoplasm cannot be excluded. Clinical correlation is recommended.  ASSESSMENT & PLAN:   27 y.o. 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 -This would be in keeping with the slow progression of nodular lymphocyte predominant Hodgkin's lymphoma. -PET CT 10/22/2022 revealed Hypermetabolic cervical, pelvic and inguinal adenopathy as well as tonsillar enlargement.   PLAN: -Discussed lab results from 08/11/2023. CBC shows normal blood counts CMP within normal limits LDH within normal limits Patient has no constitutional symptoms No lab or clinical findings suggestive of progression of his nodular lymphocyte predominant Hodgkin's lymphoma. -Continue vitamin D replacement  -No indication for additional treatment of his nodular lymphocyte predominant Hodgkin's lymphoma at this time.  FOLLOW UP: RTC with Dr Candise Che with labs in 6 months   The total time spent in the appointment was 21  minutes*.  All of the patient's questions were answered with apparent satisfaction. The patient knows to call the clinic with any problems, questions or concerns.   Wyvonnia Lora MD MS AAHIVMS [facility] Hematology/Oncology Physician [facility]  .*Total Encounter Time as defined by the Centers for Medicare and Medicaid Services includes, in addition to the face-to-face time of a patient visit (documented in the note above) non-face-to-face time: obtaining and reviewing outside history, ordering and reviewing medications, tests or procedures, care coordination (communications with other health care professionals or caregivers) and documentation in the medical record.

## 2023-12-27 ENCOUNTER — Encounter: Payer: Self-pay | Admitting: Hematology

## 2024-02-04 ENCOUNTER — Other Ambulatory Visit: Payer: Self-pay

## 2024-02-04 DIAGNOSIS — C8108 Nodular lymphocyte predominant Hodgkin lymphoma, lymph nodes of multiple sites: Secondary | ICD-10-CM

## 2024-02-07 ENCOUNTER — Inpatient Hospital Stay (HOSPITAL_BASED_OUTPATIENT_CLINIC_OR_DEPARTMENT_OTHER): Payer: BC Managed Care – PPO | Admitting: Hematology

## 2024-02-07 ENCOUNTER — Inpatient Hospital Stay: Payer: BC Managed Care – PPO | Attending: Hematology

## 2024-02-07 ENCOUNTER — Encounter: Payer: Self-pay | Admitting: Hematology

## 2024-02-07 VITALS — BP 123/59 | HR 77 | Temp 98.1°F | Resp 17 | Wt 207.5 lb

## 2024-02-07 DIAGNOSIS — Z8572 Personal history of non-Hodgkin lymphomas: Secondary | ICD-10-CM | POA: Insufficient documentation

## 2024-02-07 DIAGNOSIS — R599 Enlarged lymph nodes, unspecified: Secondary | ICD-10-CM | POA: Insufficient documentation

## 2024-02-07 DIAGNOSIS — C8108 Nodular lymphocyte predominant Hodgkin lymphoma, lymph nodes of multiple sites: Secondary | ICD-10-CM

## 2024-02-07 DIAGNOSIS — R634 Abnormal weight loss: Secondary | ICD-10-CM | POA: Insufficient documentation

## 2024-02-07 LAB — CMP (CANCER CENTER ONLY)
ALT: 15 U/L (ref 0–44)
AST: 19 U/L (ref 15–41)
Albumin: 4.5 g/dL (ref 3.5–5.0)
Alkaline Phosphatase: 70 U/L (ref 38–126)
Anion gap: 4 — ABNORMAL LOW (ref 5–15)
BUN: 20 mg/dL (ref 6–20)
CO2: 28 mmol/L (ref 22–32)
Calcium: 9.6 mg/dL (ref 8.9–10.3)
Chloride: 106 mmol/L (ref 98–111)
Creatinine: 1.05 mg/dL (ref 0.61–1.24)
GFR, Estimated: >60 mL/min (ref >60)
Glucose, Bld: 89 mg/dL (ref 70–99)
Potassium: 4.1 mmol/L (ref 3.5–5.1)
Sodium: 138 mmol/L (ref 135–145)
Total Bilirubin: 0.7 mg/dL (ref 0.0–1.2)
Total Protein: 7.1 g/dL (ref 6.5–8.1)

## 2024-02-07 LAB — LACTATE DEHYDROGENASE: LDH: 128 U/L (ref 98–192)

## 2024-02-07 LAB — CBC WITH DIFFERENTIAL (CANCER CENTER ONLY)
Abs Immature Granulocytes: 0.01 10*3/uL (ref 0.00–0.07)
Basophils Absolute: 0 10*3/uL (ref 0.0–0.1)
Basophils Relative: 0 %
Eosinophils Absolute: 0.2 10*3/uL (ref 0.0–0.5)
Eosinophils Relative: 4 %
HCT: 44.9 % (ref 39.0–52.0)
Hemoglobin: 15.2 g/dL (ref 13.0–17.0)
Immature Granulocytes: 0 %
Lymphocytes Relative: 45 %
Lymphs Abs: 2.3 10*3/uL (ref 0.7–4.0)
MCH: 27.9 pg (ref 26.0–34.0)
MCHC: 33.9 g/dL (ref 30.0–36.0)
MCV: 82.5 fL (ref 80.0–100.0)
Monocytes Absolute: 0.5 10*3/uL (ref 0.1–1.0)
Monocytes Relative: 9 %
Neutro Abs: 2.2 10*3/uL (ref 1.7–7.7)
Neutrophils Relative %: 42 %
Platelet Count: 241 10*3/uL (ref 150–400)
RBC: 5.44 MIL/uL (ref 4.22–5.81)
RDW: 12.3 % (ref 11.5–15.5)
WBC Count: 5.2 10*3/uL (ref 4.0–10.5)
nRBC: 0 % (ref 0.0–0.2)

## 2024-02-07 NOTE — Progress Notes (Signed)
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 male who has been referred to [place] by Dr Christia Reading for evaluation and management of newly diagnosed nodular lymphocyte predominant Hodgkin's lymphoma.  Patient has no significant chronic medical issues and was seen by Dr. Christia Reading for initial consultation on 06/04/2022 with increasing size of left upper neck lymph nodes. He notes that he has had enlarged lymph nodes in his neck on and off all the time in his neck and groin as well as under the armpits for the last 5+ years but recently the left neck lymph nodes were getting larger since about April 2023. Patient notes that he has been aggressively working out and did lose some weight on account of dietary changes and extensive physical exercise regimen and feels that this could have also made his lymph nodes feel more prominent.  Of note he did have a CT of the maxillofacial region with Dr. Nelva Nay MD in October 2017 which showed mild bilateral posterior cervical lymphadenopathy as well as significantly enlarged left submandibular adenopathy.  The largest lymph node measured 17 x 11 mm in size in the left submandibular region. this may be inflammatory in etiology but neoplasm cannot be ruled out.  After seeing Dr. Christia Reading the patient had a CT of the neck on 06/11/2022 which showed a cluster of homogeneously enlarged lymph nodes in the left neck with the largest being in the submandibular space at about 3.2 cm.  Lymphoma or granulomatous disease are the leading considerations.  Patient eventually had a surgical biopsy of his left submandibular lymph node on 09/21/2022 with Dr. Christia Reading which showed nodular lymphocyte predominant Hodgkin's  lymphoma.  Patient notes weight loss of about 10-15 pounds over the last couple of years though this is in the context of dietary changes and heavy physical exercise.  No significant new fatigue.  No unexplained fevers or chills.  No drenching night sweats. Acute chest pain or shortness of breath. No abdominal pain or distention. No focal bone pains or generalized bone pains. Skin rashes or pruritus. Denies any concerning unsafe sexual exposure or body fluid exposure. Denies use of IV drugs.   INTERVAL HISTORY:  Benjamin Doyle is a 28 y.o. male is here for his 3-month follow-up for his nodular lymphocyte predominant Hodgkin's lymphoma.  Patient was last seen by me on 08/11/2023 and he was doing well overall since our last visit.   Patient notes he has been doing well overall since our last visit. He denies any new infection issues, fever, chills, night sweats, unexpected weight loss, bone pain, chest pain, back pain, testicular pain, testicular swelling, or leg swelling.   He does report enlarged lymph nodes near left neck, which have not increased in size.   MEDICAL HISTORY:  Past Medical History:  Diagnosis Date   Asthma    Heart murmur     SURGICAL HISTORY: Past Surgical History:  Procedure Laterality Date   TYMPANOSTOMY TUBE PLACEMENT      SOCIAL HISTORY: Social History   Socioeconomic History   Marital status: Single    Spouse name: Not on file   Number of children: Not on file   Years of education: Not on file   Highest education level: Not  on file  Occupational History   Not on file  Tobacco Use   Smoking status: Never   Smokeless tobacco: Never  Substance and Sexual Activity   Alcohol use: No   Drug use: No   Sexual activity: Not on file  Other Topics Concern   Not on file  Social History Narrative   Not on file   Social Drivers of Health   Financial Resource Strain: Not on file  Food Insecurity: Not on file  Transportation Needs: Not on file   Physical Activity: Not on file  Stress: Not on file  Social Connections: Not on file  Intimate Partner Violence: Not on file  Notes no cigarette smoking or vaping. Minimal social alcohol use. No IV drug use.  FAMILY HISTORY: Family History  Problem Relation Age of Onset   Hypertension Mother    Hyperlipidemia Father    Hypertension Father    Hypertension Maternal Grandmother    Diabetes Maternal Grandfather    Birth defects Paternal Grandmother    Hyperlipidemia Paternal Grandmother    Hypertension Paternal Grandmother    Hypertension Paternal Grandfather    Alcohol abuse Neg Hx    Arthritis Neg Hx    Asthma Neg Hx    Cancer Neg Hx    COPD Neg Hx    Depression Neg Hx    Drug abuse Neg Hx    Early death Neg Hx    Hearing loss Neg Hx    Heart disease Neg Hx    Kidney disease Neg Hx    Learning disabilities Neg Hx    Mental illness Neg Hx    Mental retardation Neg Hx    Miscarriages / Stillbirths Neg Hx    Stroke Neg Hx    Vision loss Neg Hx    Varicose Veins Neg Hx     ALLERGIES:  has no known allergies.  MEDICATIONS:  Current Outpatient Medications  Medication Sig Dispense Refill   albuterol (PROVENTIL HFA;VENTOLIN HFA) 108 (90 BASE) MCG/ACT inhaler Inhale 2 puffs into the lungs every 6 (six) hours as needed for wheezing or shortness of breath. 2 Inhaler 3   cetirizine (ZYRTEC) 10 MG tablet Take 1 tablet (10 mg total) by mouth daily. 30 tablet 2   ergocalciferol (VITAMIN D2) 1.25 MG (50000 UT) capsule Take 1 capsule (50,000 Units total) by mouth once a week. 12 capsule 3   ondansetron (ZOFRAN) 8 MG tablet Take 1 tablet (8 mg total) by mouth every 8 (eight) hours as needed for nausea or vomiting. 20 tablet 0   No current facility-administered medications for this visit.    REVIEW OF SYSTEMS:    .10 Point review of Systems was done is negative except as noted above.   PHYSICAL EXAMINATION: ECOG PERFORMANCE STATUS: 1 - Symptomatic but completely  ambulatory .BP (!) 123/59 (BP Location: Left Arm, Patient Position: Sitting)   Pulse 77   Temp 98.1 F (36.7 C) (Temporal)   Resp 17   Wt 207 lb 8 oz (94.1 kg)   SpO2 100%   BMI 25.94 kg/m  NAD GENERAL:alert, in no acute distress and comfortable SKIN: no acute rashes, no significant lesions EYES: conjunctiva are pink and non-injected, sclera anicteric OROPHARYNX: MMM, no exudates, no oropharyngeal erythema or ulceration NECK: supple, no JVD LYMPH:  no palpable lymphadenopathy in the cervical, axillary or inguinal regions LUNGS: clear to auscultation b/l with normal respiratory effort HEART: regular rate & rhythm ABDOMEN:  normoactive bowel sounds , non tender, not distended. Extremity: no  pedal edema PSYCH: alert & oriented x 3 with fluent speech NEURO: no focal motor/sensory deficits   LABORATORY DATA:  I have reviewed the data as listed     Latest Ref Rng & Units 02/07/2024   10:09 AM 08/11/2023   10:02 AM 02/03/2023   10:25 AM  CBC  WBC 4.0 - 10.5 K/uL 5.2  4.3  3.8   Hemoglobin 13.0 - 17.0 g/dL 16.1  09.6  04.5   Hematocrit 39.0 - 52.0 % 44.9  44.8  45.1   Platelets 150 - 400 K/uL 241  240  259       Latest Ref Rng & Units 02/07/2024   10:09 AM 08/11/2023   10:02 AM 02/03/2023   10:25 AM  CMP  Glucose 70 - 99 mg/dL 89  79  83   BUN 6 - 20 mg/dL 20  16  12    Creatinine 0.61 - 1.24 mg/dL 4.09  8.11  9.14   Sodium 135 - 145 mmol/L 138  138  139   Potassium 3.5 - 5.1 mmol/L 4.1  4.2  4.0   Chloride 98 - 111 mmol/L 106  107  107   CO2 22 - 32 mmol/L 28  30  29    Calcium 8.9 - 10.3 mg/dL 9.6  9.1  9.5   Total Protein 6.5 - 8.1 g/dL 7.1  7.0  6.4   Total Bilirubin 0.0 - 1.2 mg/dL 0.7  1.0  0.8   Alkaline Phos 38 - 126 U/L 70  70  58   AST 15 - 41 U/L 19  19  14    ALT 0 - 44 U/L 15  18  12     . Lab Results  Component Value Date   LDH 126 08/11/2023        Latest Ref Rng & Units 10/02/2022    1:05 PM  Hepatitis  Hep B Surface Ag NON REACTIVE NON REACTIVE   Hep  C Ab NON REACTIVE NON REACTIVE    HIV antibody 10/02/2022: Non-reactive RPR 10/02/2022: Non-reactive  Last vitamin D Lab Results  Component Value Date   VD25OH 13.45 (L) 10/02/2022    Lab Results  Component Value Date   ESRSEDRATE 3 10/02/2022    LDH 10/02/2022: 146   RADIOGRAPHIC STUDIES: I have personally reviewed the radiological images as listed and agreed with the findings in the report.  NM PET Image Initial (PI) Skull Base To Thigh Result Date: 10/26/2022 CLINICAL DATA:  Initial treatment strategy for Hodgkin's lymphoma. EXAM: NUCLEAR MEDICINE PET SKULL BASE TO THIGH TECHNIQUE: 9.82 mCi F-18 FDG was injected intravenously. Full-ring PET imaging was performed from the skull base to thigh after the radiotracer. CT data was obtained and used for attenuation correction and anatomic localization. Fasting blood glucose: 88 mg/dl COMPARISON:  CT neck June 11, 2022 FINDINGS: Mediastinal blood pool activity: SUV max 1.6 Liver activity: SUV max 2.7 NECK: Left-greater-than-right hypermetabolic cervical adenopathy. For reference: -left level 2b lymph node measures 19 x 10 mm on image 74/3 with a max SUV of 5.6. -right level 2b lymph node measures 22 x 14 mm on image 63/3 with a max SUV of 6.0 Hypermetabolic hyperplasia of the tonsils with a max SUV of 17.4. Incidental CT findings: None. CHEST: No hypermetabolic thoracic adenopathy. No hypermetabolic pulmonary nodules or masses. Physiologic metabolic activity in the thyroid remnant with a max SUV of 3.01. Incidental CT findings: None. ABDOMEN/PELVIS: Hypermetabolic bilateral pelvic sidewall and iliac side chain and inguinal lymph nodes. For reference: -  Hypermetabolic right pelvic sidewall lymph node measures 2.5 x 0.9 cm on image 290/3 with a max SUV of 14.8. -Hypermetabolic left pelvic sidewall lymph node measures 2.2 x 1.1 cm on image 289/3 with a max SUV of 6.8. No abnormal hypermetabolic activity in the liver or spleen. Hypermetabolic descending and  sigmoid colonic activity is favored physiologic. Incidental CT findings: No splenomegaly. SKELETON: No focal hypermetabolic activity to suggest skeletal metastasis. Incidental CT findings: None. IMPRESSION: 1. Hypermetabolic cervical, pelvic and inguinal adenopathy compatible with patient's known diagnosis of Hodgkin's lymphoma. 2. Hypermetabolic hyperplasia of the tonsils, likely reflects lymphomatous disease involvement. 3. No splenomegaly. Electronically Signed   By: Maudry Mayhew M.D.   On: 10/26/2022 08:55    CT neck 06/11/2022 IMPRESSION:  Cluster of homogeneously enlarged lymph nodes in the left neck, largest being in the submandibular space at up to 3.2 cm. Lymphoma or granulomatous disease are the leading considerations.   CT Maxillofacial 19/28/2017 IMPRESSION: Mild bilateral maxillary sinusitis. Mild bilateral posterior cervical adenopathy is noted, as well as significantly enlarged left submandibular adenopathy. This may simply be inflammatory in etiology, but neoplasm cannot be excluded. Clinical correlation is recommended.  ASSESSMENT & PLAN:   28 y.o. 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 -This would be in keeping with the slow progression of nodular lymphocyte predominant Hodgkin's lymphoma. -PET CT 10/22/2022 revealed Hypermetabolic cervical, pelvic and inguinal adenopathy as well as tonsillar enlargement.   PLAN: -Discussed lab results from today, 02/07/2024, in detail with the patient. CBC stable. CMP pending.  -On physical exam, the lymph node neat his left neck are not enlarged. They are just normal in size.  -No lab or clinical findings suggestive of progression of his nodular lymphocyte predominant  Hodgkin's lymphoma. -Continue vitamin D replacement  -No indication for additional treatment of his nodular lymphocyte predominant Hodgkin's lymphoma at this time. -Discussed we will get PET or CT scan before our next visit.  -Answered all of patient's questions.  Ct neck/chest/abd/pelvis in 7 months RTC with Dr Candise Che with labs in 8 months  FOLLOW UP: Ct neck/chest/abd/pelvis in 7 months RTC with Dr Candise Che with labs in 8 months  The total time spent in the appointment was 21 minutes* .  All of the patient's questions were answered with apparent satisfaction. The patient knows to call the clinic with any problems, questions or concerns.   Wyvonnia Lora MD MS AAHIVMS [facility] Hematology/Oncology Physician [facility]  .*Total Encounter Time as defined by the Centers for Medicare and Medicaid Services includes, in addition to the face-to-face time of a patient visit (documented in the note above) non-face-to-face time: obtaining and reviewing outside history, ordering and reviewing medications, tests or procedures, care coordination (communications with other health care professionals or caregivers) and documentation in the medical record.   I,Param Shah,acting as a Neurosurgeon for Wyvonnia Lora, MD.,have documented all relevant documentation on the behalf of Wyvonnia Lora, MD,as directed by  Wyvonnia Lora, MD while in the presence of Wyvonnia Lora, MD.

## 2024-02-08 ENCOUNTER — Telehealth: Payer: Self-pay | Admitting: Hematology

## 2024-02-08 NOTE — Telephone Encounter (Signed)
Tried calling patient unable to leave vm mailbox not set up

## 2024-02-09 ENCOUNTER — Other Ambulatory Visit: Payer: Self-pay

## 2024-02-13 ENCOUNTER — Encounter: Payer: Self-pay | Admitting: Hematology

## 2024-09-05 ENCOUNTER — Encounter: Payer: Self-pay | Admitting: Hematology

## 2024-09-12 ENCOUNTER — Ambulatory Visit (HOSPITAL_COMMUNITY): Payer: Self-pay

## 2024-10-06 ENCOUNTER — Other Ambulatory Visit: Payer: Self-pay

## 2024-10-06 DIAGNOSIS — C8108 Nodular lymphocyte predominant Hodgkin lymphoma, lymph nodes of multiple sites: Secondary | ICD-10-CM

## 2024-10-09 ENCOUNTER — Inpatient Hospital Stay: Payer: BC Managed Care – PPO | Attending: Hematology | Admitting: Hematology

## 2024-10-09 ENCOUNTER — Inpatient Hospital Stay: Payer: BC Managed Care – PPO

## 2024-10-09 NOTE — Progress Notes (Incomplete)
 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 male who has been referred to us  by Dr Vaughan Ricker for evaluation and management of newly diagnosed nodular lymphocyte predominant Hodgkin's lymphoma.  Patient has no significant chronic medical issues and was seen by Dr. Vaughan Ricker for initial consultation on 06/04/2022 with increasing size of left upper neck lymph nodes. He notes that he has had enlarged lymph nodes in his neck on and off all the time in his neck and groin as well as under the armpits for the last 5+ years but recently the left neck lymph nodes were getting larger since about April 2023. Patient notes that he has been aggressively working out and did lose some weight on account of dietary changes and extensive physical exercise regimen and feels that this could have also made his lymph nodes feel more prominent.  Of note he did have a CT of the maxillofacial region with Dr. Lamar Presser MD in October 2017 which showed mild bilateral posterior cervical lymphadenopathy as well as significantly enlarged left submandibular adenopathy.  The largest lymph node measured 17 x 11 mm in size in the left submandibular region. this may be inflammatory in etiology but neoplasm cannot be ruled out.  After seeing Dr. Vaughan Ricker the patient had a CT of the neck on 06/11/2022 which showed a cluster of homogeneously enlarged lymph nodes in the left neck with the largest being in the submandibular space at about 3.2 cm.  Lymphoma or granulomatous disease are the leading considerations.  Patient eventually had a surgical biopsy of his left submandibular lymph node on 09/21/2022 with Dr. Vaughan Ricker which showed nodular lymphocyte predominant  Hodgkin's lymphoma.  Patient notes weight loss of about 10-15 pounds over the last couple of years though this is in the context of dietary changes and heavy physical exercise.  No significant new fatigue.  No unexplained fevers or chills.  No drenching night sweats. Acute chest pain or shortness of breath. No abdominal pain or distention. No focal bone pains or generalized bone pains. Skin rashes or pruritus. Denies any concerning unsafe sexual exposure or body fluid exposure. Denies use of IV drugs.   INTERVAL HISTORY:  Benjamin Doyle is a 28 y.o. male is here for his 69-month follow-up for his nodular lymphocyte predominant Hodgkin's lymphoma. Last seen by me on 02/07/2024 and he was doing well overall, noting enlarged lymph nodes near left neck, which had not increased in size at that time.  Today he   MEDICAL HISTORY:  Past Medical History:  Diagnosis Date   Asthma    Heart murmur     SURGICAL HISTORY: Past Surgical History:  Procedure Laterality Date   TYMPANOSTOMY TUBE PLACEMENT      SOCIAL HISTORY: Social History   Socioeconomic History   Marital status: Single    Spouse name: Not on file   Number of children: Not on file   Years of education: Not on file   Highest education level: Not on file  Occupational History   Not on file  Tobacco Use   Smoking status: Never   Smokeless tobacco: Never  Substance and Sexual Activity   Alcohol use: No   Drug use: No   Sexual activity:  Not on file  Other Topics Concern   Not on file  Social History Narrative   Not on file   Social Drivers of Health   Financial Resource Strain: Not on file  Food Insecurity: Not on file  Transportation Needs: Not on file  Physical Activity: Not on file  Stress: Not on file  Social Connections: Not on file  Intimate Partner Violence: Not on file  Notes no cigarette smoking or vaping. Minimal social alcohol use. No IV drug use.  FAMILY HISTORY: Family History  Problem  Relation Age of Onset   Hypertension Mother    Hyperlipidemia Father    Hypertension Father    Hypertension Maternal Grandmother    Diabetes Maternal Grandfather    Birth defects Paternal Grandmother    Hyperlipidemia Paternal Grandmother    Hypertension Paternal Grandmother    Hypertension Paternal Grandfather    Alcohol abuse Neg Hx    Arthritis Neg Hx    Asthma Neg Hx    Cancer Neg Hx    COPD Neg Hx    Depression Neg Hx    Drug abuse Neg Hx    Early death Neg Hx    Hearing loss Neg Hx    Heart disease Neg Hx    Kidney disease Neg Hx    Learning disabilities Neg Hx    Mental illness Neg Hx    Mental retardation Neg Hx    Miscarriages / Stillbirths Neg Hx    Stroke Neg Hx    Vision loss Neg Hx    Varicose Veins Neg Hx     ALLERGIES:  has no known allergies.  MEDICATIONS:  Current Outpatient Medications  Medication Sig Dispense Refill   albuterol  (PROVENTIL  HFA;VENTOLIN  HFA) 108 (90 BASE) MCG/ACT inhaler Inhale 2 puffs into the lungs every 6 (six) hours as needed for wheezing or shortness of breath. 2 Inhaler 3   cetirizine  (ZYRTEC ) 10 MG tablet Take 1 tablet (10 mg total) by mouth daily. 30 tablet 2   ergocalciferol  (VITAMIN D2) 1.25 MG (50000 UT) capsule Take 1 capsule (50,000 Units total) by mouth once a week. 12 capsule 3   ondansetron  (ZOFRAN ) 8 MG tablet Take 1 tablet (8 mg total) by mouth every 8 (eight) hours as needed for nausea or vomiting. 20 tablet 0   No current facility-administered medications for this visit.    REVIEW OF SYSTEMS:    .10 Point review of Systems was done is negative except as noted above.   PHYSICAL EXAMINATION: ECOG PERFORMANCE STATUS: 1 - Symptomatic but completely ambulatory .There were no vitals taken for this visit. NAD GENERAL:alert, in no acute distress and comfortable SKIN: no acute rashes, no significant lesions EYES: conjunctiva are pink and non-injected, sclera anicteric OROPHARYNX: MMM, no exudates, no oropharyngeal  erythema or ulceration NECK: supple, no JVD LYMPH:  no palpable lymphadenopathy in the cervical, axillary or inguinal regions LUNGS: clear to auscultation b/l with normal respiratory effort HEART: regular rate & rhythm ABDOMEN:  normoactive bowel sounds , non tender, not distended. Extremity: no pedal edema PSYCH: alert & oriented x 3 with fluent speech NEURO: no focal motor/sensory deficits   LABORATORY DATA:  I have reviewed the data as listed     Latest Ref Rng & Units 02/07/2024   10:09 AM 08/11/2023   10:02 AM 02/03/2023   10:25 AM  CBC  WBC 4.0 - 10.5 K/uL 5.2  4.3  3.8   Hemoglobin 13.0 - 17.0 g/dL 84.7  84.5  15.7  Hematocrit 39.0 - 52.0 % 44.9  44.8  45.1   Platelets 150 - 400 K/uL 241  240  259       Latest Ref Rng & Units 02/07/2024   10:09 AM 08/11/2023   10:02 AM 02/03/2023   10:25 AM  CMP  Glucose 70 - 99 mg/dL 89  79  83   BUN 6 - 20 mg/dL 20  16  12    Creatinine 0.61 - 1.24 mg/dL 8.94  8.99  9.05   Sodium 135 - 145 mmol/L 138  138  139   Potassium 3.5 - 5.1 mmol/L 4.1  4.2  4.0   Chloride 98 - 111 mmol/L 106  107  107   CO2 22 - 32 mmol/L 28  30  29    Calcium 8.9 - 10.3 mg/dL 9.6  9.1  9.5   Total Protein 6.5 - 8.1 g/dL 7.1  7.0  6.4   Total Bilirubin 0.0 - 1.2 mg/dL 0.7  1.0  0.8   Alkaline Phos 38 - 126 U/L 70  70  58   AST 15 - 41 U/L 19  19  14    ALT 0 - 44 U/L 15  18  12     . Lab Results  Component Value Date   LDH 128 02/07/2024        Latest Ref Rng & Units 10/02/2022    1:05 PM  Hepatitis  Hep B Surface Ag NON REACTIVE NON REACTIVE   Hep C Ab NON REACTIVE NON REACTIVE    HIV antibody 10/02/2022: Non-reactive RPR 10/02/2022: Non-reactive  Last vitamin D  Lab Results  Component Value Date   VD25OH 13.45 (L) 10/02/2022    Lab Results  Component Value Date   ESRSEDRATE 3 10/02/2022    LDH 10/02/2022: 146   RADIOGRAPHIC STUDIES: I have personally reviewed the radiological images as listed and agreed with the findings in the  report.  NM PET Image Initial (PI) Skull Base To Thigh Result Date: 10/26/2022 CLINICAL DATA:  Initial treatment strategy for Hodgkin's lymphoma. EXAM: NUCLEAR MEDICINE PET SKULL BASE TO THIGH TECHNIQUE: 9.82 mCi F-18 FDG was injected intravenously. Full-ring PET imaging was performed from the skull base to thigh after the radiotracer. CT data was obtained and used for attenuation correction and anatomic localization. Fasting blood glucose: 88 mg/dl COMPARISON:  CT neck June 11, 2022 FINDINGS: Mediastinal blood pool activity: SUV max 1.6 Liver activity: SUV max 2.7 NECK: Left-greater-than-right hypermetabolic cervical adenopathy. For reference: -left level 2b lymph node measures 19 x 10 mm on image 74/3 with a max SUV of 5.6. -right level 2b lymph node measures 22 x 14 mm on image 63/3 with a max SUV of 6.0 Hypermetabolic hyperplasia of the tonsils with a max SUV of 17.4. Incidental CT findings: None. CHEST: No hypermetabolic thoracic adenopathy. No hypermetabolic pulmonary nodules or masses. Physiologic metabolic activity in the thyroid remnant with a max SUV of 3.01. Incidental CT findings: None. ABDOMEN/PELVIS: Hypermetabolic bilateral pelvic sidewall and iliac side chain and inguinal lymph nodes. For reference: -Hypermetabolic right pelvic sidewall lymph node measures 2.5 x 0.9 cm on image 290/3 with a max SUV of 14.8. -Hypermetabolic left pelvic sidewall lymph node measures 2.2 x 1.1 cm on image 289/3 with a max SUV of 6.8. No abnormal hypermetabolic activity in the liver or spleen. Hypermetabolic descending and sigmoid colonic activity is favored physiologic. Incidental CT findings: No splenomegaly. SKELETON: No focal hypermetabolic activity to suggest skeletal metastasis. Incidental CT findings: None. IMPRESSION: 1. Hypermetabolic  cervical, pelvic and inguinal adenopathy compatible with patient's known diagnosis of Hodgkin's lymphoma. 2. Hypermetabolic hyperplasia of the tonsils, likely reflects  lymphomatous disease involvement. 3. No splenomegaly. Electronically Signed   By: Reyes Holder M.D.   On: 10/26/2022 08:55    CT neck 06/11/2022 IMPRESSION:  Cluster of homogeneously enlarged lymph nodes in the left neck, largest being in the submandibular space at up to 3.2 cm. Lymphoma or granulomatous disease are the leading considerations.   CT Maxillofacial 19/28/2017 IMPRESSION: Mild bilateral maxillary sinusitis. Mild bilateral posterior cervical adenopathy is noted, as well as significantly enlarged left submandibular adenopathy. This may simply be inflammatory in etiology, but neoplasm cannot be excluded. Clinical correlation is recommended.  ASSESSMENT & PLAN:   28 y.o. 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 -This would be in keeping with the slow progression of nodular lymphocyte predominant Hodgkin's lymphoma. -PET CT 10/22/2022 revealed Hypermetabolic cervical, pelvic and inguinal adenopathy as well as tonsillar enlargement.   PLAN: -Discussed lab results from today, 02/07/2024, in detail with the patient. CBC stable. CMP pending.  -On physical exam, the lymph node neat his left neck are not enlarged. They are just normal in size.  -No lab or clinical findings suggestive of progression of his nodular lymphocyte predominant Hodgkin's lymphoma. -Continue vitamin D  replacement  -No indication for additional treatment of his nodular lymphocyte predominant Hodgkin's lymphoma at this time. -Discussed we will get PET or CT scan before our next visit.  -Answered all of patient's questions.  Ct neck/chest/abd/pelvis in 7 months RTC with Dr Onesimo with labs in 8 months  FOLLOW UP: Ct neck/chest/abd/pelvis in 7  months RTC with Dr Onesimo with labs in 8 months  The total time spent in the appointment was 21 minutes* .  All of the patient's questions were answered with apparent satisfaction. The patient knows to call the clinic with any problems, questions or concerns.   Emaline Onesimo MD MS AAHIVMS [facility] Hematology/Oncology Physician [facility]  .*Total Encounter Time as defined by the Centers for Medicare and Medicaid Services includes, in addition to the face-to-face time of a patient visit (documented in the note above) non-face-to-face time: obtaining and reviewing outside history, ordering and reviewing medications, tests or procedures, care coordination (communications with other health care professionals or caregivers) and documentation in the medical record.   I, Damien Blanks, acting as a Neurosurgeon for Emaline Onesimo, MD.,have documented all relevant documentation on the behalf of Emaline Onesimo, MD,as directed by  Emaline Onesimo, MD while in the presence of Emaline Onesimo, MD.

## 2024-12-31 ENCOUNTER — Other Ambulatory Visit: Payer: Self-pay

## 2025-01-25 ENCOUNTER — Encounter: Payer: Self-pay | Admitting: Hematology

## 2025-01-29 ENCOUNTER — Encounter: Payer: Self-pay | Admitting: Hematology

## 2025-01-30 ENCOUNTER — Ambulatory Visit (HOSPITAL_COMMUNITY): Payer: Self-pay

## 2025-01-30 ENCOUNTER — Encounter: Payer: Self-pay | Admitting: Hematology

## 2025-01-30 DIAGNOSIS — C8108 Nodular lymphocyte predominant Hodgkin lymphoma, lymph nodes of multiple sites: Secondary | ICD-10-CM

## 2025-01-30 DIAGNOSIS — C8590 Non-Hodgkin lymphoma, unspecified, unspecified site: Secondary | ICD-10-CM | POA: Diagnosis not present

## 2025-01-30 MED ORDER — IOHEXOL 9 MG/ML PO SOLN
1000.0000 mL | ORAL | Status: AC
  Administered 2025-01-30: 1000 mL via ORAL

## 2025-01-30 MED ORDER — IOHEXOL 300 MG/ML  SOLN
100.0000 mL | Freq: Once | INTRAMUSCULAR | Status: AC | PRN
  Administered 2025-01-30: 100 mL via INTRAVENOUS

## 2025-02-12 ENCOUNTER — Inpatient Hospital Stay: Payer: Self-pay | Admitting: Hematology

## 2025-02-12 ENCOUNTER — Inpatient Hospital Stay: Payer: Self-pay
